# Patient Record
Sex: Male | Born: 1975 | Race: White | Hispanic: No | Marital: Married | Smoking: Former smoker
Health system: Southern US, Community
[De-identification: ages and names within clinical notes are randomized; demographics above are authoritative.]

## PROBLEM LIST (undated history)

## (undated) DIAGNOSIS — E039 Hypothyroidism, unspecified: Secondary | ICD-10-CM

## (undated) DIAGNOSIS — R011 Cardiac murmur, unspecified: Secondary | ICD-10-CM

## (undated) DIAGNOSIS — F329 Major depressive disorder, single episode, unspecified: Secondary | ICD-10-CM

## (undated) DIAGNOSIS — I10 Essential (primary) hypertension: Secondary | ICD-10-CM

## (undated) DIAGNOSIS — E119 Type 2 diabetes mellitus without complications: Secondary | ICD-10-CM

## (undated) DIAGNOSIS — F32A Depression, unspecified: Secondary | ICD-10-CM

## (undated) HISTORY — DX: Hypothyroidism, unspecified: E03.9

## (undated) HISTORY — DX: Depression, unspecified: F32.A

## (undated) HISTORY — DX: Cardiac murmur, unspecified: R01.1

## (undated) HISTORY — PX: CHOLECYSTECTOMY: SHX55

## (undated) HISTORY — DX: Major depressive disorder, single episode, unspecified: F32.9

---

## 2009-05-15 ENCOUNTER — Ambulatory Visit: Payer: Self-pay | Admitting: Family Medicine

## 2009-05-15 DIAGNOSIS — Z87891 Personal history of nicotine dependence: Secondary | ICD-10-CM | POA: Insufficient documentation

## 2009-05-15 DIAGNOSIS — F329 Major depressive disorder, single episode, unspecified: Secondary | ICD-10-CM

## 2009-05-15 DIAGNOSIS — R635 Abnormal weight gain: Secondary | ICD-10-CM | POA: Insufficient documentation

## 2009-05-15 DIAGNOSIS — E669 Obesity, unspecified: Secondary | ICD-10-CM | POA: Insufficient documentation

## 2009-05-15 DIAGNOSIS — F3289 Other specified depressive episodes: Secondary | ICD-10-CM | POA: Insufficient documentation

## 2010-01-05 ENCOUNTER — Encounter: Admission: RE | Admit: 2010-01-05 | Discharge: 2010-01-05 | Payer: Self-pay | Admitting: Family Medicine

## 2010-01-05 ENCOUNTER — Ambulatory Visit: Payer: Self-pay | Admitting: Family Medicine

## 2010-01-05 DIAGNOSIS — R079 Chest pain, unspecified: Secondary | ICD-10-CM | POA: Insufficient documentation

## 2010-01-05 DIAGNOSIS — R5381 Other malaise: Secondary | ICD-10-CM | POA: Insufficient documentation

## 2010-01-05 DIAGNOSIS — R0602 Shortness of breath: Secondary | ICD-10-CM | POA: Insufficient documentation

## 2010-01-05 DIAGNOSIS — R5383 Other fatigue: Secondary | ICD-10-CM

## 2010-01-06 ENCOUNTER — Encounter: Payer: Self-pay | Admitting: Family Medicine

## 2010-01-06 ENCOUNTER — Encounter (INDEPENDENT_AMBULATORY_CARE_PROVIDER_SITE_OTHER): Payer: Self-pay | Admitting: *Deleted

## 2010-01-06 ENCOUNTER — Ambulatory Visit: Payer: Self-pay | Admitting: Cardiology

## 2010-01-06 ENCOUNTER — Encounter: Payer: Self-pay | Admitting: Cardiology

## 2010-01-06 DIAGNOSIS — E1169 Type 2 diabetes mellitus with other specified complication: Secondary | ICD-10-CM | POA: Insufficient documentation

## 2010-01-06 DIAGNOSIS — E119 Type 2 diabetes mellitus without complications: Secondary | ICD-10-CM | POA: Insufficient documentation

## 2010-01-06 DIAGNOSIS — E785 Hyperlipidemia, unspecified: Secondary | ICD-10-CM | POA: Insufficient documentation

## 2010-01-06 DIAGNOSIS — E039 Hypothyroidism, unspecified: Secondary | ICD-10-CM | POA: Insufficient documentation

## 2010-01-06 LAB — CONVERTED CEMR LAB
AST: 60 units/L — ABNORMAL HIGH (ref 0–37)
Alkaline Phosphatase: 100 units/L (ref 39–117)
Bilirubin, Direct: 0.1 mg/dL (ref 0.0–0.3)
CO2: 26 meq/L (ref 19–32)
Calcium: 10.2 mg/dL (ref 8.4–10.5)
Chloride: 98 meq/L (ref 96–112)
Cholesterol: 305 mg/dL — ABNORMAL HIGH (ref 0–200)
Eosinophils Relative: 6 % — ABNORMAL HIGH (ref 0–5)
Glucose, Bld: 153 mg/dL — ABNORMAL HIGH (ref 70–99)
HCT: 51.2 % (ref 39.0–52.0)
Indirect Bilirubin: 0.6 mg/dL (ref 0.0–0.9)
Lymphocytes Relative: 26 % (ref 12–46)
MCHC: 33.8 g/dL (ref 30.0–36.0)
MCV: 91.6 fL (ref 78.0–100.0)
Monocytes Absolute: 0.4 10*3/uL (ref 0.1–1.0)
Monocytes Relative: 5 % (ref 3–12)
Platelets: 240 10*3/uL (ref 150–400)
RBC: 5.59 M/uL (ref 4.22–5.81)
Total Bilirubin: 0.7 mg/dL (ref 0.3–1.2)
Total Protein: 8.5 g/dL — ABNORMAL HIGH (ref 6.0–8.3)
Triglycerides: 618 mg/dL — ABNORMAL HIGH (ref <150)

## 2010-01-07 LAB — CONVERTED CEMR LAB
Free T4: 0.4 ng/dL — ABNORMAL LOW (ref 0.80–1.80)
T3, Free: 1.9 pg/mL — ABNORMAL LOW (ref 2.3–4.2)

## 2010-01-27 ENCOUNTER — Ambulatory Visit: Payer: Self-pay | Admitting: Family Medicine

## 2010-01-27 DIAGNOSIS — I10 Essential (primary) hypertension: Secondary | ICD-10-CM | POA: Insufficient documentation

## 2010-02-04 ENCOUNTER — Telehealth: Payer: Self-pay | Admitting: Pulmonary Disease

## 2010-02-25 ENCOUNTER — Telehealth: Payer: Self-pay | Admitting: Family Medicine

## 2010-08-25 NOTE — Assessment & Plan Note (Signed)
Summary: SHORT OF BREATH AND SLIGHT CHEST PAIN//VGJ   Vital Signs:  Patient profile:   35 year old male Height:      74 inches Weight:      317 pounds BMI:     40.85 O2 Sat:      96 % on Room air Pulse rate:   93 / minute BP sitting:   148 / 90  (left arm) Cuff size:   large  Vitals Entered By: Payton Spark CMA (January 05, 2010 1:10 PM)  O2 Flow:  Room air CC: DOE and chest pain x 2 months (since starting less active job)   History of Present Illness: 35 yo man here today for chest pain and DOE.  changed jobs 2 months ago and has become much less active.  having pains in chest 'much more frequently'.  pain described as a tightness that extends into the shoulder.  'i don't smoke but my ability to breathe has been cut in half'.  having 'labored' breathing w/ normal walking.  + edema of hands and feet at end of the day.  will note that he awakens from sleep 'gasping for breath'.  + loud snoring per wife, 'it's getting worse'.  + reports of waking from sleep due to apnea.  has gained 26 lbs.  has constant complaints of fatigue despite 'sleeping for hours'.  Current Medications (verified): 1)  Fluoxetine Hcl 10 Mg Caps (Fluoxetine Hcl) .Marland Kitchen.. 1 Capsule By Mouth Once Daily X 1 Wk; Then 2 Capsules By Mouth Once Daily  Allergies (verified): No Known Drug Allergies  Past History:  Past Medical History: Last updated: 05/15/2009 depression obesity  Family History: Last updated: 01/05/2010 father healthy mother DM, HTN, depression PGF ETOH sister healthy MGF- CAD M Uncle- sleep apnea  Family History: father healthy mother DM, HTN, depression PGF ETOH sister healthy MGF- CAD M Uncle- sleep apnea  Review of Systems      See HPI  Physical Exam  General:  alert, well-developed, well-nourished, and well-hydrated.  obese Head:  normocephalic and atraumatic.   Mouth:  good dentition and pharynx pink and moist.  narrow opening between tongue and palate Neck:  no masses but very  thick Lungs:  Normal respiratory effort, chest expands symmetrically. Lungs are clear to auscultation, no crackles or wheezes. Heart:  Normal rate and regular rhythm. S1 and S2 normal without gallop, murmur, click, rub or other extra sounds. Abdomen:  obese, soft, NT/ND, +BS Pulses:  + 2 carotid, radial, DP Extremities:  no C/C/E   Impression & Recommendations:  Problem # 1:  CHEST PAIN, LEFT (ICD-786.50) Assessment New pt's CP atypical but need to r/o cardiac given obesity and recent 26 lb wt gain.  EKG w/out acute abnormalities.  check CXR and refer to cards. Orders: EKG w/ Interpretation (93000) T-2 View CXR (71020TC) Cardiology Referral (Cardiology)  Problem # 2:  SHORTNESS OF BREATH (ICD-786.05) Assessment: New ? sleep apnea and likely restrictive component due to body habitus.  refer to pulm/cards. Orders: Cardiology Referral (Cardiology) Pulmonary Referral (Pulmonary)  Problem # 3:  OBESITY, UNSPECIFIED (ICD-278.00) Assessment: Deteriorated pt has gained 20+ lbs in 6 months.  check thyroid, encouraged healthy diet and regular exercise.  Problem # 4:  FATIGUE (ICD-780.79) Assessment: New pt's fatigue possibly multifactorial- ? sleep apnea, obesity, ? diabetes.  check labs.  start meds as needed.  follow closely.  Complete Medication List: 1)  Fluoxetine Hcl 10 Mg Caps (Fluoxetine hcl) .Marland Kitchen.. 1 capsule by mouth once daily x 1  wk; then 2 capsules by mouth once daily 2)  Metformin Hcl 500 Mg Tabs (Metformin hcl) .... Take 1 tab by mouth once daily x 1 week then increase to 2 tabs by mouth once daily 3)  Synthroid 100 Mcg Tabs (Levothyroxine sodium) .... Take 1 tab by mouth every morning 30 minutes before breakfast and apart from other medications  Other Orders: Venipuncture (16109) TLB-Lipid Panel (80061-LIPID) TLB-Hepatic/Liver Function Pnl (80076-HEPATIC) TLB-BMP (Basic Metabolic Panel-BMET) (80048-METABOL) TLB-TSH (Thyroid Stimulating Hormone) (84443-TSH) TLB-CBC  Platelet - w/Differential (85025-CBCD)  Patient Instructions: 1)  Please schedule a follow-up appointment in 2 weeks w/ Dr Cathey Endow to recheck your blood pressure and review where you are in the course of your work up. 2)  Someone will call you with your pulmonary and cardiology appts 3)  We'll notify you of your labs and Xray 4)  Try and make healthy food choices and get regular exercise 5)  If your symptoms worsen- increased pain, shortness of breath, or other concerns- please go to the ER 6)  Ibuprofen 600mg  (3 pills) every 6-8 hours for your rib pain 7)  Hang in there!  Prescriptions: FLUOXETINE HCL 10 MG CAPS (FLUOXETINE HCL) 1 capsule by mouth once daily x 1 wk; then 2 capsules by mouth once daily  #60 x 1   Entered and Authorized by:   Neena Rhymes MD   Signed by:   Neena Rhymes MD on 01/05/2010   Method used:   Electronically to        [facility] [phone]* (retail)       [address]       [place], [place]  [address]       Ph: [phone]       Fax: [phone]   RxID:   [id number]

## 2010-08-25 NOTE — Assessment & Plan Note (Signed)
Summary: [facility]   Visit Type:  Initial Consult Primary Provider:  Seymour Bars DO  CC:  Chest tightness-Left arm pain.  History of Present Illness: 35 yo male for evaluation of chest pain and dyspnea. Patient states that he had a murmur as a child but no other cardiac history. Over the past several months he describes an occasional chest pain. He is in the substernal area and described as a pressure radiating to the left shoulder. It lasts one to 2 minutes and resolved spontaneously. There is no associated symptom. It is not pleuritic, positional, exertional, or related to food. It resolved spontaneously. He has also noticed increased dyspnea on exertion for several months. There is no orthopnea or PND but he does state there is occasional mild edema in his feet. He also notices a 20 pound weight gain and increased fatigue. There is no heat intolerance. Because of this chest pain and dyspnea we were asked to further evaluate. Note the patient had laboratories drawn on June 13 and his TSH was 171. Cholesterol 305; Hgb 17.3; mildly elevated LFTs. HGB A1C 7.5.  Current Medications (verified): 1)  Fluoxetine Hcl 10 Mg Caps (Fluoxetine Hcl) .Marland Kitchen.. 1 Capsule By Mouth Once Daily X 1 Wk; Then 2 Capsules By Mouth Once Daily  Allergies (verified): No Known Drug Allergies  Past History:  Past Medical History: depression Heart murmur as a child  Past Surgical History: cholecystectomy  Family History: Reviewed history from 05/15/2009 and no changes required. father healthy mother DM, HTN, depression PGF ETOH sister healthy  Social History: Reviewed history from 05/15/2009 and no changes required. 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 Married to [place].  Has twin 63 yo daughters. Quit smoking Jan 2010. Works out 3 days/ wk. 3 ETOH /wk  Review of Systems       Complains of weight gain, and fatigue. no fevers or chills, productive cough, hemoptysis, dysphasia,  odynophagia, melena, hematochezia, dysuria, hematuria, rash, seizure activity, orthopnea, PND,  claudication. Remaining systems are negative.   Vital Signs:  Patient profile:   35 year old male Height:      74 inches Weight:      306.75 pounds BMI:     39.53 Pulse rate:   96 / minute Pulse rhythm:   regular Resp:     18 per minute BP sitting:   130 / 100  (right arm) Cuff size:   large  Vitals Entered By: Vikki Ports (January 06, 2010 3:11 PM)  Physical Exam  General:  Well developed/obese in NAD Skin warm/dry; tatoos Patient not depressed No peripheral clubbing Back-normal HEENT-normal/normal eyelids Neck supple/normal carotid upstroke bilaterally; no bruits; no JVD; no thyromegaly chest - CTA/ normal expansion CV - RRR/normal S1 and S2; no murmurs, rubs or gallops;  PMI nondisplaced Abdomen -NT/ND, no HSM, no mass, + bowel sounds, no bruit 2+ femoral pulses, no bruits Ext-no edema, chords, 2+ DP Neuro-grossly nonfocal     Impression & Recommendations:  Problem # 1:  CHEST PAIN, LEFT (ICD-786.50)  Symptoms atypical. Schedule stress echocardiogram.  Orders: Stress Echo (Stress Echo)  Problem # 2:  HYPOTHYROIDISM (ICD-244.9) TSH markedly elevated. Patient also complains of weight gain, fatigue and dyspnea. I will discuss this with Dr. Cathey Endow. He needs to be placed on Synthroid. This will be managed by primary care. This certainly could be contributing to his hyperlipidemia as well. The echocardiogram will also help quantify LV function.  Problem # 3:  DM (ICD-250.00) Hemoglobin A1c is elevated and  he needs to begin diet and followup with primary care for possible diabetes.  Problem # 4:  HYPERLIPIDEMIA (ICD-272.4) Begin diet, treat hypothyroidism and then repeat labs. May need therapy in the future. Management per primary care.  Problem # 5:  OBESITY, UNSPECIFIED (ICD-278.00) This may be contributing to elevated LFTs ( fatty liver); further eval of elevated LFTs  per primary care. Also needs eval for sleep apnea. Discussed weight loss. F/U blood pressures with primary care and if remains elevated, add anti-hypertensive. He needs significant life style and risk factor modification.  Patient Instructions: 1)  Your physician recommends that you schedule a follow-up appointment in: AS NEEDED PENDING TEST RESULTS 2)  Your physician has requested that you have a stress echocardiogram. For further information please visit [email or web address]  Please follow instruction sheet as given.

## 2010-08-25 NOTE — Letter (Signed)
Summary: Out of Work  [facility]  [address]   [place], [place] [address]   Phone: [phone]  Fax: [phone]    January 27, 2010   Employee:  Austin Weeks    To Whom It May Concern:   For Medical reasons, please excuse the above named employee from work for the following dates:  Start:   July 5th  End:   July 6th  If you need additional information, please feel free to contact our office.         Sincerely,    Seymour Bars DO

## 2010-08-25 NOTE — Progress Notes (Signed)
Summary: nos appt  Phone Note Call from Patient   Caller: [email or web address]  Call For: alva Summary of Call: LMTCB x2 to rsc nos from 7/12. Initial call taken by: Darletta Moll,  February 04, 2010 3:23 PM

## 2010-08-25 NOTE — Progress Notes (Signed)
Summary: Diabetic counseling appts  Phone Note From Other Clinic Call back at [phone]   Summary of Call: SW scheduler at [facility], pt has not returned calls to set up appts for Diabetic counseling Initial call taken by: Lannette Donath,  February 25, 2010 2:28 PM  Follow-up for Phone Call        I called patient and lmom &  ask that he call the scheduler back to schedule his Diabetic Counseling.Michaelle Copas  March 06, 2010 4:54 PM

## 2010-08-25 NOTE — Letter (Signed)
Summary: Work Writer at [facility] [address], [address]   [place], [place] [address]   Phone: [phone]  Fax:      January 06, 2010    Austin Weeks   The above named patient had a medical visit today at: 3:15  pm.  Please take this into consideration when reviewing the time away from work/school.      Sincerely yours,  Architectural technologist

## 2010-08-25 NOTE — Assessment & Plan Note (Signed)
Summary: new dx of DM   Vital Signs:  Patient profile:   35 year old male Weight:      307 pounds O2 Sat:      98 % on Room air Temp:     98.3 degrees F oral Pulse rate:   92 / minute BP sitting:   140 / 96  (right arm) Cuff size:   large  O2 Flow:  Room air CC: f/u from last visit and new medications   Is Patient Diabetic? Yes Did you bring your meter with you today? No   Primary Care Provider:  Seymour Bars DO  CC:  f/u from last visit and new medications  .  History of Present Illness: Austin Weeks presents for f/u newly diagnosed T2DM and hypothryoidism.  he is on Metformin 1 gram once a day and now on Synthroid 100 mg / daily.  He is feeling better.  He has a fam hx of both.  He has not had any nutrition teaching yet.  He has not gotten a meter yet.  He is tolerating the meds well.  He does not have an eye doctor.  he has not yet had a PNX, monofilament, urine microalbumin.  He is not having any blurry vision but has more frequent urination.  He admits to drinking about 5 sodas/ day which he has cut back on.      Allergies (verified): No Known Drug Allergies  Past History:  Past Medical History: depression Heart murmur as a child T2DM 12-2009 hypothyroidism  Social History: Reviewed history from 05/15/2009 and no changes required. 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 Married to [place].  Has twin 67 yo daughters. Quit smoking Jan 2010. Works out 3 days/ wk. 3 ETOH /wk  Review of Systems      See HPI  Physical Exam  General:  alert, well-developed, well-nourished, and well-hydrated.  obese Head:  normocephalic and atraumatic.   Mouth:  pharynx pink and moist.   Neck:  no masses.   Lungs:  Normal respiratory effort, chest expands symmetrically. Lungs are clear to auscultation, no crackles or wheezes. Heart:  Normal rate and regular rhythm. S1 and S2 normal without gallop, murmur, click, rub or other extra sounds. Extremities:  no LE edema Skin:   color normal.   Cervical Nodes:  No lymphadenopathy noted Psych:  good eye contact, not anxious appearing, and not depressed appearing.     Impression & Recommendations:  Problem # 1:  DM (ICD-250.00) Counseled on his new diagnosis.  He is doing well on metformin 1 gram daily.  Will set up for nutrition classes to learn diabetic diet.  He will start monitoring his sugars at home, instruced him on goals.  Will set up a dilated eye exam and will update his urine micro, monofilament and PNX at next visit.  F/U in 6 wks and will update labs then. His updated medication list for this problem includes:    Metformin Hcl 1000 Mg Tabs (Metformin hcl) .Marland Kitchen... 1 tab by mouth once a day    Lisinopril 10 Mg Tabs (Lisinopril) .Marland Kitchen... 1 tab by mouth daily  Orders: Ophthalmology Referral (Ophthalmology) Nutrition Referral (Nutrition)  Labs Reviewed: Creat: 1.42 (01/05/2010)    Reviewed HgBA1c results: 7.5 (01/06/2010)  Problem # 2:  HYPOTHYROIDISM (ICD-244.9) Doing well on meds.  TSH in 6 wks. His updated medication list for this problem includes:    Synthroid 100 Mcg Tabs (Levothyroxine sodium) .Marland Kitchen... Take 1 tab by mouth  every morning 30 minutes before breakfast and apart from other medications  Labs Reviewed: TSH: 171.482 (01/05/2010)    HgBA1c: 7.5 (01/06/2010) Chol: 305 (01/05/2010)   HDL: 30 (01/05/2010)   LDL: See Comment mg/dL (04/54/0981)   TG: 191 (01/05/2010)  Problem # 3:  DEPRESSIVE DISORDER NOT ELSEWHERE CLASSIFIED (ICD-311) Improving on meds.  Continue. His updated medication list for this problem includes:    Fluoxetine Hcl 10 Mg Caps (Fluoxetine hcl) .Marland Kitchen... 1 capsule by mouth once daily x 1 wk; then 2 capsules by mouth once daily  Problem # 4:  ESSENTIAL HYPERTENSION, BENIGN (ICD-401.1) Assessment: New  His updated medication list for this problem includes:    Lisinopril 10 Mg Tabs (Lisinopril) .Marland Kitchen... 1 tab by mouth daily  BP today: 140/96 Prior BP: 130/100 (01/06/2010)  Labs  Reviewed: K+: 4.7 (01/05/2010) Creat: : 1.42 (01/05/2010)   Chol: 305 (01/05/2010)   HDL: 30 (01/05/2010)   LDL: See Comment mg/dL (47/82/9562)   TG: 130 (01/05/2010)  Complete Medication List: 1)  Fluoxetine Hcl 10 Mg Caps (Fluoxetine hcl) .Marland Kitchen.. 1 capsule by mouth once daily x 1 wk; then 2 capsules by mouth once daily 2)  Metformin Hcl 1000 Mg Tabs (Metformin hcl) .Marland Kitchen.. 1 tab by mouth once a day 3)  Synthroid 100 Mcg Tabs (Levothyroxine sodium) .... Take 1 tab by mouth every morning 30 minutes before breakfast and apart from other medications 4)  Lisinopril 10 Mg Tabs (Lisinopril) .Marland Kitchen.. 1 tab by mouth daily 5)  One Touch Ultra Glucometer  .... Dx: 250.00 use as directed 6)  One Touch Ultra Test Strips  .... Use once daily as directed 7)  Monolet Lancets Misc (Lancets) .... Use daily as directed  Patient Instructions: 1)  START CHECK BLOOD SUGARS: 2)  AM FASTING GOAL 80-110 3)  2 HRS AFTER DINNER GOAL <150. 4)  YOU DO NOT NEED TO CHECK IT TWICE A DAY EVERYDAY. 5)  Will set you up for nutritionist visit and diabetic eye exam. 6)  Next visit: labs, Pneumovax, urine microalbumin. 7)  Add Lisinopril once a day for BP. 8)  REturn for f/u diabetes/ BP in 6 wks. Prescriptions: MONOLET LANCETS  MISC (LANCETS) use daily as directed  #100 x 1   Entered and Authorized by:   Seymour Bars DO   Signed by:   Seymour Bars DO on 01/27/2010   Method used:   Electronically to        [facility] [address] [address] (retail)       [address]       [place], [place]  [address]       Ph: [phone]       Fax: [phone]   RxID:   [id number] ONE TOUCH ULTRA TEST STRIPS use once daily as directed  #30 x 3   Entered and Authorized by:   Seymour Bars DO   Signed by:   Seymour Bars DO on 01/27/2010   Method used:   Printed then faxed to ...       [facility] [address] (retail)       [address]       [place], [place]  [address]       Ph: [phone]        Fax: [phone]   RxID:   [id number] ONE TOUCH ULTRA GLUCOMETER Dx: 250.00 use as directed  #1 x 0   Entered and Authorized by:  Seymour Bars DO   Signed by:   Seymour Bars DO on 01/27/2010   Method used:   Printed then faxed to ...       [facility] (retail)       [address]       [place], [place]  [address]       Ph: [phone]       Fax: [phone]   RxID:   [id number] LISINOPRIL 10 MG TABS (LISINOPRIL) 1 tab by mouth daily  #30 x 1   Entered and Authorized by:   Seymour Bars DO   Signed by:   Seymour Bars DO on 01/27/2010   Method used:   Electronically to        [facility] [address] (retail)       [address]       [place], [place]  [address]       Ph: [phone]       Fax: [phone]   RxID:   [id number]

## 2011-03-09 ENCOUNTER — Encounter: Payer: Self-pay | Admitting: Cardiology

## 2015-04-28 ENCOUNTER — Encounter: Payer: Self-pay | Admitting: Physician Assistant

## 2015-04-28 ENCOUNTER — Ambulatory Visit (INDEPENDENT_AMBULATORY_CARE_PROVIDER_SITE_OTHER): Payer: BLUE CROSS/BLUE SHIELD | Admitting: Physician Assistant

## 2015-04-28 VITALS — BP 116/80 | HR 79 | Ht 74.0 in | Wt 252.0 lb

## 2015-04-28 DIAGNOSIS — E118 Type 2 diabetes mellitus with unspecified complications: Secondary | ICD-10-CM | POA: Insufficient documentation

## 2015-04-28 DIAGNOSIS — F32A Depression, unspecified: Secondary | ICD-10-CM

## 2015-04-28 DIAGNOSIS — Z23 Encounter for immunization: Secondary | ICD-10-CM | POA: Diagnosis not present

## 2015-04-28 DIAGNOSIS — F411 Generalized anxiety disorder: Secondary | ICD-10-CM | POA: Insufficient documentation

## 2015-04-28 DIAGNOSIS — E038 Other specified hypothyroidism: Secondary | ICD-10-CM

## 2015-04-28 DIAGNOSIS — F329 Major depressive disorder, single episode, unspecified: Secondary | ICD-10-CM

## 2015-04-28 DIAGNOSIS — Z794 Long term (current) use of insulin: Secondary | ICD-10-CM | POA: Insufficient documentation

## 2015-04-28 DIAGNOSIS — I10 Essential (primary) hypertension: Secondary | ICD-10-CM

## 2015-04-28 LAB — POCT GLYCOSYLATED HEMOGLOBIN (HGB A1C): Hemoglobin A1C: >14

## 2015-04-28 MED ORDER — DAPAGLIFLOZIN PRO-METFORMIN ER 10-1000 MG PO TB24: 1.0000 | ORAL_TABLET | Freq: Every day | ORAL | Status: DC

## 2015-04-28 MED ORDER — OMEPRAZOLE 40 MG PO CPDR: 40.0000 mg | DELAYED_RELEASE_CAPSULE | Freq: Every day | ORAL | Status: DC

## 2015-04-28 MED ORDER — INSULIN GLARGINE 100 UNIT/ML SOLOSTAR PEN: 35.0000 [IU] | PEN_INJECTOR | Freq: Every day | SUBCUTANEOUS | Status: DC

## 2015-04-28 MED ORDER — FLUOXETINE HCL 10 MG PO CAPS
10.0000 mg | ORAL_CAPSULE | Freq: Every day | ORAL | Status: DC
Start: 2015-04-28 — End: 2015-10-24

## 2015-04-28 MED ORDER — LEVOTHYROXINE SODIUM 100 MCG PO TABS: 100.0000 ug | ORAL_TABLET | Freq: Every day | ORAL | Status: DC

## 2015-04-28 MED ORDER — AMBULATORY NON FORMULARY MEDICATION: Status: DC

## 2015-04-28 MED ORDER — LISINOPRIL 5 MG PO TABS: 5.0000 mg | ORAL_TABLET | Freq: Every day | ORAL | Status: DC

## 2015-05-01 DIAGNOSIS — F32A Depression, unspecified: Secondary | ICD-10-CM | POA: Insufficient documentation

## 2015-05-01 DIAGNOSIS — F329 Major depressive disorder, single episode, unspecified: Secondary | ICD-10-CM | POA: Insufficient documentation

## 2015-05-01 NOTE — Progress Notes (Signed)
Subjective:    Patient ID: Austin Weeks, male    DOB: Nov 29, 1975, 39 y.o.   MRN: [id number]  HPI Pt is a 39 yo male who presents to the clinic to establish care.   .. Active Ambulatory Problems    Diagnosis Date Noted  . HYPOTHYROIDISM 01/06/2010  . DM 01/06/2010  . HYPERLIPIDEMIA 01/06/2010  . OBESITY, UNSPECIFIED 05/15/2009  . DEPRESSIVE DISORDER NOT ELSEWHERE CLASSIFIED 05/15/2009  . ESSENTIAL HYPERTENSION, BENIGN 01/27/2010  . FATIGUE 01/05/2010  . WEIGHT GAIN 05/15/2009  . SHORTNESS OF BREATH 01/05/2010  . CHEST PAIN, LEFT 01/05/2010  . TOBACCO USE, QUIT 05/15/2009  . Generalized anxiety disorder 04/28/2015  . Type 2 diabetes mellitus with complication (HCC) 04/28/2015  . Other specified hypothyroidism 04/28/2015  . Depression 05/01/2015   Resolved Ambulatory Problems    Diagnosis Date Noted  . No Resolved Ambulatory Problems   Past Medical History  Diagnosis Date  . Depressed   . Heart murmur   . Hypothyroidism    .Marland Kitchen Family History  Problem Relation Age of Onset  . Depression Mother   . Diabetes Mother   . Hypertension Mother   . Hyperlipidemia Mother   . Sleep apnea Maternal Uncle   . Alcohol abuse Maternal Uncle   . Diabetes Maternal Uncle   . Coronary artery disease Maternal Grandfather   . Heart attack Maternal Grandfather   . Alcohol abuse Paternal Grandfather   . Stroke Paternal Grandfather   . Diabetes Maternal Aunt   . Alcohol abuse Paternal Uncle   . Cancer Maternal Grandmother   . Diabetes Maternal Aunt    .Marland Kitchen Social History   Social History  . Marital Status: Married    Spouse Name: N/A  . Number of Children: N/A  . Years of Education: N/A   Occupational History  . Not on file.   Social History Main Topics  . Smoking status: Former Games developer  . Smokeless tobacco: Not on file     Comment: quit Jan 2010   . Alcohol Use: Yes     Comment: 3 etoh/week  . Drug Use: No  . Sexual Activity: Yes   Other Topics Concern  . Not on file    Social History Narrative   Works out 3 days/wk.    Pt presents to the clinic to get medications.   Pt is a diabetic and not taken any medications in the last 3 months. He knows his sugars are elevated. He is very thristy and frequent urination. Not checking sugars. No neuropathy. He has had some blurred vision.   GERD- needs refill for omeprazole.   Depression-would like to start prozac back.     Review of Systems  All other systems reviewed and are negative.      Objective:   Physical Exam  Constitutional: He is oriented to person, place, and time. He appears well-developed and well-nourished.  HENT:  Head: Normocephalic and atraumatic.  Cardiovascular: Normal rate, regular rhythm and normal heart sounds.   Pulmonary/Chest: Effort normal and breath sounds normal.  Neurological: He is alert and oriented to person, place, and time.  Skin: Skin is dry.  Psychiatric: He has a normal mood and affect. His behavior is normal.          Assessment & Plan:  DM- .Marland Kitchen Lab Results  Component Value Date   HGBA1C >14 04/28/2015   Pt has been out of medications for months.  Restart lantus and metformin.  Add xigduo daily. Discussed SE's.  Discussed  needs eye exam.  mircoalbumin extremely high. Pt is on lisinopril.  Keep monitoring blood glucose level keep fasting between 80-120.  Flu and pneumovax 23.  Follow up in 3 months.   Depression/anxiety- restart prozac  daily.   GERD- restarted omeprazole  daily.

## 2015-07-29 ENCOUNTER — Ambulatory Visit: Payer: Self-pay | Admitting: Physician Assistant

## 2015-10-08 ENCOUNTER — Telehealth: Payer: Self-pay | Admitting: *Deleted

## 2015-10-08 MED ORDER — INSULIN GLARGINE 300 UNIT/ML ~~LOC~~ SOPN: 35.0000 [IU] | PEN_INJECTOR | Freq: Every day | SUBCUTANEOUS | Status: DC

## 2015-10-08 NOTE — Telephone Encounter (Addendum)
Received PA for lantus for patient. I feel this medication will be denied since he has not tried any of the preferred drugs on the formulary. This was confirmed when I called patient.The patient states he has been out of medication.Marland Kitchen.spoke with Lesly Rubensteinjade and  Per Lesly RubensteinJade ok to dispense sample of toujeo to patient.Pt has an appt at the end of this month.asked that he bring insurance card. Will still submit PA on lantus  Received a denial for lantus, denial letter place in [place]

## 2015-10-24 ENCOUNTER — Ambulatory Visit (INDEPENDENT_AMBULATORY_CARE_PROVIDER_SITE_OTHER): Payer: BLUE CROSS/BLUE SHIELD | Admitting: Physician Assistant

## 2015-10-24 ENCOUNTER — Encounter: Payer: Self-pay | Admitting: Physician Assistant

## 2015-10-24 VITALS — BP 132/84 | HR 83 | Wt 274.0 lb

## 2015-10-24 DIAGNOSIS — Z794 Long term (current) use of insulin: Secondary | ICD-10-CM

## 2015-10-24 DIAGNOSIS — E039 Hypothyroidism, unspecified: Secondary | ICD-10-CM

## 2015-10-24 DIAGNOSIS — I10 Essential (primary) hypertension: Secondary | ICD-10-CM

## 2015-10-24 DIAGNOSIS — F411 Generalized anxiety disorder: Secondary | ICD-10-CM

## 2015-10-24 DIAGNOSIS — K0889 Other specified disorders of teeth and supporting structures: Secondary | ICD-10-CM

## 2015-10-24 DIAGNOSIS — F329 Major depressive disorder, single episode, unspecified: Secondary | ICD-10-CM

## 2015-10-24 DIAGNOSIS — E118 Type 2 diabetes mellitus with unspecified complications: Secondary | ICD-10-CM | POA: Diagnosis not present

## 2015-10-24 DIAGNOSIS — F32A Depression, unspecified: Secondary | ICD-10-CM

## 2015-10-24 LAB — POCT GLYCOSYLATED HEMOGLOBIN (HGB A1C): Hemoglobin A1C: 10.2

## 2015-10-24 MED ORDER — OMEPRAZOLE 40 MG PO CPDR: 40.0000 mg | DELAYED_RELEASE_CAPSULE | Freq: Every day | ORAL | Status: DC

## 2015-10-24 MED ORDER — FLUOXETINE HCL 10 MG PO CAPS: 10.0000 mg | ORAL_CAPSULE | Freq: Every day | ORAL | Status: DC

## 2015-10-24 MED ORDER — DAPAGLIFLOZIN PRO-METFORMIN ER 10-1000 MG PO TB24: 1.0000 | ORAL_TABLET | Freq: Every day | ORAL | Status: DC

## 2015-10-24 MED ORDER — ATORVASTATIN CALCIUM 40 MG PO TABS: 40.0000 mg | ORAL_TABLET | Freq: Every day | ORAL | Status: DC

## 2015-10-24 MED ORDER — HYDROCODONE-ACETAMINOPHEN 5-325 MG PO TABS: 1.0000 | ORAL_TABLET | Freq: Three times a day (TID) | ORAL | Status: DC | PRN

## 2015-10-24 MED ORDER — INSULIN GLARGINE 300 UNIT/ML ~~LOC~~ SOPN: 35.0000 [IU] | PEN_INJECTOR | Freq: Every day | SUBCUTANEOUS | Status: DC

## 2015-10-24 MED ORDER — LISINOPRIL 5 MG PO TABS: 5.0000 mg | ORAL_TABLET | Freq: Every day | ORAL | Status: DC

## 2015-10-24 NOTE — Progress Notes (Signed)
Subjective:    Patient ID: Austin Weeks, male    DOB: 1976-05-31, 40 y.o.   MRN: [id number]  HPI    Review of Systems     Objective:   Physical Exam        Assessment & Plan:   Subjective:     Austin Weeks is a 40 y.o. male who presents for follow up of diabetes.. Current symptoms include: hyperglycemia. Patient denies foot ulcerations, hypoglycemia , paresthesia of the feet, polydipsia, polyuria, visual disturbances and vomiting. Evaluation to date has been: hemoglobin A1C. Home sugars: patient does not check sugars. Current treatments: more intensive attention to diet which has been ineffective and pt is on toujeo, xigduo, lisinopril. he has been out of meds due to insurance cost and issues. . Last dilated eye exam unknown.  Pt needs synthyroid refill as well.   The following portions of the patient's history were reviewed and updated as appropriate: allergies, current medications, past family history, past medical history, past social history, past surgical history and problem list.  Review of Systems Pertinent items are noted in HPI. Pertinent items noted in HPI and remainder of comprehensive ROS otherwise negative.    Objective:    BP 132/84 mmHg  Pulse 83  Wt 274 lb (124.286 kg)  General Appearance:    Alert, cooperative, no distress, appears stated age  Head:    Normocephalic, without obvious abnormality, atraumatic  Eyes:    PERRL, conjunctiva/corneas clear, EOM's intact, fundi    benign, both eyes       Ears:    Normal TM's and external ear canals, both ears  Nose:   Nares normal, septum midline, mucosa normal, no drainage    or sinus tenderness  Throat:   Lips, mucosa, and tongue normal; teeth and gums normal  Neck:   Supple, symmetrical, trachea midline, no adenopathy;       thyroid:  No enlargement/tenderness/nodules; no carotid   bruit or JVD  Back:     Symmetric, no curvature, ROM normal, no CVA tenderness  Lungs:     Clear to auscultation  bilaterally, respirations unlabored  Chest wall:    No tenderness or deformity  Heart:    Regular rate and rhythm, S1 and S2 normal, no murmur, rub   or gallop  Abdomen:     Soft, non-tender, bowel sounds active all four quadrants,    no masses, no organomegaly  Genitalia:    Normal male without lesion, discharge or tenderness  Rectal:    Normal tone, normal prostate, no masses or tenderness;   guaiac negative stool  Extremities:   Extremities normal, atraumatic, no cyanosis or edema  Pulses:   2+ and symmetric all extremities  Skin:   Skin color, texture, turgor normal, no rashes or lesions  Lymph nodes:   Cervical, supraclavicular, and axillary nodes normal  Neurologic:   CNII-XII intact. Normal strength, sensation and reflexes      throughout      @  Patient was not evaluated for proper footwear and sizing.  Laboratory: No components found for: A1C    Assessment:    Diabetes mellitus Type II, under poor control.    Plan:    .Marland Kitchen Lab Results  Component Value Date   HGBA1C 10.2 10/24/2015   Pt encouraged to start checking sugars.  Continue xigduo  Discussed general issues about diabetes pathophysiology and management. Discussed foot care. Reminded to get yearly retinal exam. Increased dose of insulin: toujeo to increase until  fasting sugars between 100-120 every morning. . Continued ACE inhibitor; see medication orders. Reminded to bring in blood sugar diary at next visit.    hypothyroidism- start back on levothyroxine and will recheck in 3 months.

## 2015-10-28 ENCOUNTER — Other Ambulatory Visit: Payer: Self-pay | Admitting: *Deleted

## 2015-10-28 MED ORDER — INSULIN DEGLUDEC 100 UNIT/ML ~~LOC~~ SOPN: 35.0000 [IU] | PEN_INJECTOR | Freq: Every day | SUBCUTANEOUS | Status: DC

## 2015-10-28 NOTE — Telephone Encounter (Signed)
Inititated PA but will possibly be denied due to not having tried formulary alternatives levemir,basaglar Evaristo Buryresiba. PA faxed. Ok to for patient to try [place]-B[place]uresiba per Cydney OkJade  Jade can you write this rx since this will be the initial rx we are sending  Left message with mom for him to call me back

## 2015-10-29 ENCOUNTER — Other Ambulatory Visit: Payer: Self-pay | Admitting: *Deleted

## 2015-10-29 MED ORDER — LEVOTHYROXINE SODIUM 100 MCG PO TABS: 100.0000 ug | ORAL_TABLET | Freq: Every day | ORAL | Status: DC

## 2015-11-03 NOTE — Telephone Encounter (Signed)
Called patient and the Evaristo Buryresiba was $500. I let patient know that there is a copay card for this med and I will leave it up front for him to pick up

## 2015-11-05 ENCOUNTER — Emergency Department (INDEPENDENT_AMBULATORY_CARE_PROVIDER_SITE_OTHER)
Admission: EM | Admit: 2015-11-05 | Discharge: 2015-11-05 | Disposition: A | Discharge status: Home / Self Care | Payer: BLUE CROSS/BLUE SHIELD | Source: Home / Self Care | Attending: Emergency Medicine | Admitting: Emergency Medicine

## 2015-11-05 ENCOUNTER — Encounter: Payer: Self-pay | Admitting: Emergency Medicine

## 2015-11-05 DIAGNOSIS — J0120 Acute ethmoidal sinusitis, unspecified: Secondary | ICD-10-CM

## 2015-11-05 LAB — POCT INFLUENZA A/B
INFLUENZA A, POC: NEGATIVE
Influenza B, POC: NEGATIVE

## 2015-11-05 MED ORDER — AMOXICILLIN 500 MG PO CAPS: 500.0000 mg | ORAL_CAPSULE | Freq: Three times a day (TID) | ORAL | Status: DC

## 2015-11-05 NOTE — ED Provider Notes (Signed)
CSN: [id number]     Arrival date & time 11/05/15  1209 History   First MD Initiated Contact with Patient 11/05/15 1258     Chief Complaint  Patient presents with  . URI   (Consider location/radiation/quality/duration/timing/severity/associated sxs/prior Treatment) Patient is a 40 y.o. male presenting with URI. The history is provided by the patient. No language interpreter was used.  URI Presenting symptoms: congestion, cough, ear pain, facial pain and sore throat   Severity:  Moderate Onset quality:  Gradual Timing:  Constant Progression:  Worsening Chronicity:  New Relieved by:  Nothing Worsened by:  Nothing tried Ineffective treatments:  None tried Associated symptoms: sinus pain   Risk factors: no sick contacts   Pt complains of congestion.  Pt concerned.  He reports he gets frequent infections and is susceptible to  Past Medical History  Diagnosis Date  . Depressed   . Heart murmur     as a child  . Hypothyroidism    Past Surgical History  Procedure Laterality Date  . Cholecystectomy     Family History  Problem Relation Age of Onset  . Depression Mother   . Diabetes Mother   . Hypertension Mother   . Hyperlipidemia Mother   . Sleep apnea Maternal Uncle   . Alcohol abuse Maternal Uncle   . Diabetes Maternal Uncle   . Coronary artery disease Maternal Grandfather   . Heart attack Maternal Grandfather   . Alcohol abuse Paternal Grandfather   . Stroke Paternal Grandfather   . Diabetes Maternal Aunt   . Alcohol abuse Paternal Uncle   . Cancer Maternal Grandmother   . Diabetes Maternal Aunt    Social History  Substance Use Topics  . Smoking status: Former Games developer  . Smokeless tobacco: None     Comment: quit Jan 2010   . Alcohol Use: Yes     Comment: 3 etoh/week    Review of Systems  HENT: Positive for congestion, ear pain and sore throat.   Respiratory: Positive for cough.   All other systems reviewed and are negative.   Allergies  Review of patient's  allergies indicates no known allergies.  Home Medications   Prior to Admission medications   Medication Sig Start Date End Date Taking? Authorizing Provider  AMBULATORY NON FORMULARY MEDICATION One touch ultra 2  Testing 2 twice a day. For Diabetes Mellitus type II, uncontrolled. 04/28/15   Jade L Breeback, PA-C  amoxicillin (AMOXIL) 500 MG capsule Take 1 capsule (500 mg total) by mouth 3 (three) times daily. 11/05/15   Elson Areas, PA-C  atorvastatin (LIPITOR) 40 MG tablet Take 1 tablet (40 mg total) by mouth daily. 10/24/15   Jade L Breeback, PA-C  Dapagliflozin-Metformin HCl ER (XIGDUO XR) 04-999 MG TB24 Take 1 tablet by mouth daily. 10/24/15   Jade L Breeback, PA-C  FLUoxetine (PROZAC) 10 MG capsule Take 1 capsule (10 mg total) by mouth daily. 10/24/15   Jade L Breeback, PA-C  glucose blood test strip 1 each by Other route as directed. Use as instructed     Historical Provider, MD  HYDROcodone-acetaminophen (NORCO/VICODIN) 5-325 MG tablet Take 1 tablet by mouth every 8 (eight) hours as needed for moderate pain. 10/24/15   Jade L Breeback, PA-C  Insulin Degludec (TRESIBA FLEXTOUCH) 100 UNIT/ML SOPN Inject 35 Units into the skin at bedtime. 10/28/15   Jade L Breeback, PA-C  Insulin Glargine (TOUJEO SOLOSTAR) 300 UNIT/ML SOPN Inject 35 Units into the skin at bedtime. 10/24/15   Jomarie Longs,  PA-C  levothyroxine (SYNTHROID) 100 MCG tablet Take 1 tablet (100 mcg total) by mouth daily before breakfast. Before breakfast and apart from other meds. 10/29/15   Jade L Breeback, PA-C  lisinopril (PRINIVIL,ZESTRIL) 5 MG tablet Take 1 tablet (5 mg total) by mouth daily. 10/24/15   Jomarie LongsJade L Breeback, PA-C  Monolet Lancets MISC by Does not apply route as directed.      Historical Provider, MD  omeprazole (PRILOSEC) 40 MG capsule Take 1 capsule (40 mg total) by mouth daily. 10/24/15   Jomarie LongsJade L Breeback, PA-C   Meds Ordered and Administered this Visit  Medications - No data to display  BP 137/99 mmHg  Pulse 93   Temp(Src) 98.2 F (36.8 C) (Oral)  Ht 6\' 1"  (1.854 m)  Wt 272 lb (123.378 kg)  BMI 35.89 kg/m2  SpO2 97% No data found.   Physical Exam  Constitutional: He is oriented to person, place, and time. He appears well-developed and well-nourished.  HENT:  Head: Normocephalic and atraumatic.  Right Ear: External ear normal.  Left Ear: External ear normal.  Mouth/Throat: Oropharynx is clear and moist.  Tender sinuses  Eyes: Conjunctivae and EOM are normal. Pupils are equal, round, and reactive to light.  Neck: Normal range of motion.  Cardiovascular: Normal rate.   Pulmonary/Chest: Effort normal.  Musculoskeletal: Normal range of motion.  Neurological: He is alert and oriented to person, place, and time.  Psychiatric: He has a normal mood and affect.  Nursing note and vitals reviewed.   ED Course  Procedures (including critical care time)  Labs Review Labs Reviewed  POCT INFLUENZA A/B    Imaging Review No results found.   Visual Acuity Review  Right Eye Distance:   Left Eye Distance:   Bilateral Distance:    Right Eye Near:   Left Eye Near:    Bilateral Near:         MDM Pt concerned because of his medical problems and frequent illness.  This illness may be viral however given level of concern I will give pt antibiotic.  He is advised to return if worse.   1. Acute ethmoidal sinusitis, recurrence not specified    Meds ordered this encounter  Medications  . DISCONTD: amoxicillin (AMOXIL) 500 MG capsule    Sig: Take 1 capsule (500 mg total) by mouth 3 (three) times daily.    Dispense:  30 capsule    Refill:  0    Order Specific Question:  Supervising Provider    Answer:  Georgina PillionMASSEY, Dekendrick [[id number]]  . amoxicillin (AMOXIL) 500 MG capsule    Sig: Take 1 capsule (500 mg total) by mouth 3 (three) times daily.    Dispense:  30 capsule    Refill:  0    Order Specific Question:  Supervising Provider    Answer:  Georgina PillionMASSEY, Lateef [[id number]]  An After Visit Summary was printed  and given to the patient.    Lonia SkinnerLeslie K WinfieldSofia, PA-C 11/05/15 [phone]

## 2015-11-05 NOTE — ED Notes (Signed)
Woke up this morning with Congestion, runny nose, ears hurt, sore throat

## 2015-11-05 NOTE — Discharge Instructions (Signed)

## 2015-12-08 ENCOUNTER — Emergency Department (INDEPENDENT_AMBULATORY_CARE_PROVIDER_SITE_OTHER)
Admission: EM | Admit: 2015-12-08 | Discharge: 2015-12-08 | Disposition: A | Discharge status: Home / Self Care | Payer: BLUE CROSS/BLUE SHIELD | Source: Home / Self Care | Attending: Family Medicine | Admitting: Family Medicine

## 2015-12-08 ENCOUNTER — Encounter: Payer: Self-pay | Admitting: *Deleted

## 2015-12-08 DIAGNOSIS — M545 Low back pain, unspecified: Secondary | ICD-10-CM

## 2015-12-08 DIAGNOSIS — M5441 Lumbago with sciatica, right side: Secondary | ICD-10-CM

## 2015-12-08 DIAGNOSIS — G8929 Other chronic pain: Secondary | ICD-10-CM

## 2015-12-08 DIAGNOSIS — Z8739 Personal history of other diseases of the musculoskeletal system and connective tissue: Secondary | ICD-10-CM

## 2015-12-08 HISTORY — DX: Type 2 diabetes mellitus without complications: E11.9

## 2015-12-08 HISTORY — DX: Essential (primary) hypertension: I10

## 2015-12-08 MED ORDER — PREDNISONE 20 MG PO TABS: ORAL_TABLET | ORAL | Status: DC

## 2015-12-08 MED ORDER — METHOCARBAMOL 500 MG PO TABS: 500.0000 mg | ORAL_TABLET | Freq: Two times a day (BID) | ORAL | Status: DC

## 2015-12-08 MED ORDER — MELOXICAM 7.5 MG PO TABS: 15.0000 mg | ORAL_TABLET | Freq: Every day | ORAL | Status: DC

## 2015-12-08 MED ORDER — HYDROCODONE-ACETAMINOPHEN 5-325 MG PO TABS: 1.0000 | ORAL_TABLET | Freq: Four times a day (QID) | ORAL | Status: DC | PRN

## 2015-12-08 MED ORDER — KETOROLAC TROMETHAMINE 60 MG/2ML IM SOLN
60.0000 mg | Freq: Once | INTRAMUSCULAR | Status: AC
  Administered 2015-12-08: 60 mg via INTRAMUSCULAR

## 2015-12-08 NOTE — ED Notes (Signed)
Pt reports h/o back pain due to 3 herniated disc. Pain intermittently flares up. Felt pain coming on 3 days ago. When he sat up in bed this AM he felt more sudden pain. Pain radiates to right leg. No otc meds taken this AM, took Aleve 2 days ago.

## 2015-12-08 NOTE — Discharge Instructions (Signed)
Norco/Vicodin (hydrocodone-acetaminophen) is a narcotic pain medication, do not combine these medications with others containing tylenol. While taking, do not drink alcohol, drive, or perform any other activities that requires focus while taking these medications.   Meloxicam (Mobic) is an antiinflammatory to help with pain and inflammation.  Do not take ibuprofen, Advil, Aleve, or any other medications that contain NSAIDs while taking meloxicam as this may cause stomach upset or even ulcers if taken in large amounts for an extended period of time.   Robaxin is a muscle relaxer and may cause drowsiness. Do not drink alcohol, drive, or operate heavy machinery while taking.

## 2015-12-08 NOTE — ED Provider Notes (Signed)
CSN: [id number]     Arrival date & time 12/08/15  0805 History   First MD Initiated Contact with Patient 12/08/15 0830     Chief Complaint  Patient presents with  . Back Pain   (Consider location/radiation/quality/duration/timing/severity/associated sxs/prior Treatment) HPI The pt is a 40yo male presenting to [facility] with c/o exacerbation of his chronic lower back pain. Pt reports hx of 3 herniated discs in his back that causes flare ups of back pain from time to time. The other day he started to have lower back soreness so he took Aleve with temporary relief, however, this morning when his alarm went off he sat up quickly due to being startled awake, causing him to pull his back again.  Pain is aching and sore, worse with certain movements, 7/10 at worst.  Pain radiates down his Right leg like it normally does.  He attempted to work a few hours this morning but had to leave work due to the severe pain.  He notes he has had Toradol injections before, which have helped as well as prednisone.  Denies new injuries, no heavy lifting or falls.  Denies hx of back surgeries. No change in bowel or bladder habits.   Past Medical History  Diagnosis Date  . Depressed   . Heart murmur     as a child  . Hypothyroidism   . Diabetes mellitus without complication (HCC)   . Hypertension    Past Surgical History  Procedure Laterality Date  . Cholecystectomy     Family History  Problem Relation Age of Onset  . Depression Mother   . Diabetes Mother   . Hypertension Mother   . Hyperlipidemia Mother   . Sleep apnea Maternal Uncle   . Alcohol abuse Maternal Uncle   . Diabetes Maternal Uncle   . Coronary artery disease Maternal Grandfather   . Heart attack Maternal Grandfather   . Alcohol abuse Paternal Grandfather   . Stroke Paternal Grandfather   . Diabetes Maternal Aunt   . Alcohol abuse Paternal Uncle   . Cancer Maternal Grandmother   . Diabetes Maternal Aunt    Social History  Substance Use  Topics  . Smoking status: Former Games developer  . Smokeless tobacco: None     Comment: quit Jan 2010   . Alcohol Use: Yes     Comment: 3 etoh/week    Review of Systems  Constitutional: Negative for fever and chills.  Gastrointestinal: Negative for nausea, vomiting and abdominal pain.  Genitourinary: Negative for dysuria, urgency, frequency, hematuria and flank pain.  Musculoskeletal: Positive for myalgias and back pain. Negative for joint swelling, arthralgias and gait problem.  Skin: Negative for color change, rash and wound.  Neurological: Negative for weakness and numbness.    Allergies  Review of patient's allergies indicates no known allergies.  Home Medications   Prior to Admission medications   Medication Sig Start Date End Date Taking? Authorizing Provider  atorvastatin (LIPITOR) 40 MG tablet Take 1 tablet (40 mg total) by mouth daily. 10/24/15  Yes Jade L Breeback, PA-C  Dapagliflozin-Metformin HCl ER (XIGDUO XR) 04-999 MG TB24 Take 1 tablet by mouth daily. 10/24/15  Yes Jade L Breeback, PA-C  FLUoxetine (PROZAC) 10 MG capsule Take 1 capsule (10 mg total) by mouth daily. 10/24/15  Yes Jade L Breeback, PA-C  Insulin Degludec (TRESIBA FLEXTOUCH) 100 UNIT/ML SOPN Inject 35 Units into the skin at bedtime. 10/28/15  Yes Jade L Breeback, PA-C  levothyroxine (SYNTHROID) 100 MCG tablet Take 1 tablet (  100 mcg total) by mouth daily before breakfast. Before breakfast and apart from other meds. 10/29/15  Yes Jade L Breeback, PA-C  lisinopril (PRINIVIL,ZESTRIL) 5 MG tablet Take 1 tablet (5 mg total) by mouth daily. 10/24/15  Yes Jade L Breeback, PA-C  AMBULATORY NON FORMULARY MEDICATION One touch ultra 2  Testing 2 twice a day. For Diabetes Mellitus type II, uncontrolled. 04/28/15   Jade L Breeback, PA-C  glucose blood test strip 1 each by Other route as directed. Use as instructed     Historical Provider, MD  HYDROcodone-acetaminophen (NORCO/VICODIN) 5-325 MG tablet Take 1 tablet by mouth every 8  (eight) hours as needed for moderate pain. 10/24/15   Jade L Breeback, PA-C  HYDROcodone-acetaminophen (NORCO/VICODIN) 5-325 MG tablet Take 1 tablet by mouth every 6 (six) hours as needed for moderate pain or severe pain. 12/08/15   Junius Finner, PA-C  Insulin Glargine (TOUJEO SOLOSTAR) 300 UNIT/ML SOPN Inject 35 Units into the skin at bedtime. 10/24/15   Jade L Breeback, PA-C  meloxicam (MOBIC) 7.5 MG tablet Take 2 tablets (15 mg total) by mouth daily. For 5 days, then 1-2 tabs (7.5-15mg ) daily as needed for pain. 12/08/15   Junius Finner, PA-C  methocarbamol (ROBAXIN) 500 MG tablet Take 1 tablet (500 mg total) by mouth 2 (two) times daily. 12/08/15   Junius Finner, PA-C  Monolet Lancets MISC by Does not apply route as directed.      Historical Provider, MD  omeprazole (PRILOSEC) 40 MG capsule Take 1 capsule (40 mg total) by mouth daily. 10/24/15   Jade L Breeback, PA-C  predniSONE (DELTASONE) 20 MG tablet 3 tabs po day one, then 2 po daily x 4 days 12/08/15   Junius Finner, PA-C   Meds Ordered and Administered this Visit   Medications  ketorolac (TORADOL) injection 60 mg (60 mg Intramuscular Given 12/08/15 0843)    BP 122/85 mmHg  Pulse 100  Temp(Src) 97.9 F (36.6 C) (Oral)  Wt 274 lb (124.286 kg)  SpO2 97% No data found.   Physical Exam  Constitutional: He is oriented to person, place, and time. He appears well-developed and well-nourished.  HENT:  Head: Normocephalic and atraumatic.  Eyes: EOM are normal.  Neck: Normal range of motion. Neck supple.  Cardiovascular: Normal rate, regular rhythm and normal heart sounds.   Pulmonary/Chest: Effort normal and breath sounds normal. No respiratory distress. He has no wheezes. He has no rales.  Musculoskeletal: Normal range of motion. He exhibits tenderness. He exhibits no edema.  Mild tenderness to lower lumbar spine, tenderness to Right lumbar muscles and buttock.  Positive straight leg raise on the Right. Full ROM upper and lower extremities  with 5/5 strength.   Neurological: He is alert and oriented to person, place, and time.  Normal gait.   Skin: Skin is warm and dry. No rash noted. No erythema.  Psychiatric: He has a normal mood and affect. His behavior is normal.  Nursing note and vitals reviewed.   ED Course  Procedures (including critical care time)  Labs Review Labs Reviewed - No data to display  Imaging Review No results found.    MDM   1. History of herniated intervertebral disc   2. Chronic low back pain   3. Right-sided low back pain with right-sided sciatica    Exacerbation of chronic low back pain w/o known injury. No red flag symptoms.   Tx: Toradol  IM Rx: prednisone, norco, Meloxicam and Robaxin.   Pt may return to work today  with light duty for 3 days. F/u with PCP or Sports Medicine if symptoms keep recurring. Patient verbalized understanding and agreement with treatment plan.     Junius FinnerErin O'Malley, PA-C 12/08/15 [phone]

## 2015-12-11 ENCOUNTER — Other Ambulatory Visit: Payer: Self-pay | Admitting: *Deleted

## 2015-12-11 MED ORDER — FLUOXETINE HCL 10 MG PO CAPS
10.0000 mg | ORAL_CAPSULE | Freq: Every day | ORAL | Status: DC
Start: 2015-12-11 — End: 2016-12-27

## 2016-01-23 ENCOUNTER — Ambulatory Visit: Payer: Self-pay | Admitting: Physician Assistant

## 2016-01-31 ENCOUNTER — Other Ambulatory Visit: Payer: Self-pay | Admitting: Physician Assistant

## 2016-02-02 ENCOUNTER — Other Ambulatory Visit: Payer: Self-pay | Admitting: *Deleted

## 2016-02-02 MED ORDER — DAPAGLIFLOZIN PRO-METFORMIN ER 10-1000 MG PO TB24: 1.0000 | ORAL_TABLET | Freq: Every day | ORAL | Status: DC

## 2016-02-03 ENCOUNTER — Other Ambulatory Visit: Payer: Self-pay | Admitting: Physician Assistant

## 2016-02-14 ENCOUNTER — Other Ambulatory Visit: Payer: Self-pay | Admitting: Physician Assistant

## 2016-02-18 ENCOUNTER — Ambulatory Visit: Payer: Self-pay | Admitting: Physician Assistant

## 2016-03-12 ENCOUNTER — Emergency Department (INDEPENDENT_AMBULATORY_CARE_PROVIDER_SITE_OTHER)
Admission: EM | Admit: 2016-03-12 | Discharge: 2016-03-12 | Disposition: A | Discharge status: Home / Self Care | Payer: BLUE CROSS/BLUE SHIELD | Source: Home / Self Care | Attending: Family Medicine | Admitting: Family Medicine

## 2016-03-12 ENCOUNTER — Encounter: Payer: Self-pay | Admitting: Emergency Medicine

## 2016-03-12 DIAGNOSIS — K0889 Other specified disorders of teeth and supporting structures: Secondary | ICD-10-CM

## 2016-03-12 MED ORDER — CLINDAMYCIN HCL 300 MG PO CAPS: 300.0000 mg | ORAL_CAPSULE | Freq: Three times a day (TID) | ORAL | 0 refills | Status: DC

## 2016-03-12 NOTE — Discharge Instructions (Signed)
May take Ibuprofen 200mg, 4 tabs every 8 hours with food.   If symptoms become significantly worse during the night or over the weekend, proceed to the local emergency room.  

## 2016-03-12 NOTE — ED Provider Notes (Signed)
[facility] We[facility]    CSN: [id number] Arrival date & time: 03/12/16  1259  First Provider Contact:  None       History   Chief Complaint Chief Complaint  Patient presents with  . Dental Pain    HPI Austin Weeks is a 40 y.o. male.   Patient complains of recurring toothache in his right mandible for about 4 months.  About 3 days ago he had swelling in the gingiva of the affected tooth that subsequently drained.  He denies swelling in face or fever.  He has a dental appointment scheduled on 03/17/16.   The history is provided by the patient.  Dental Pain  Location:  Lower Lower teeth location:  31/RL 2nd molar Quality:  Aching Severity:  Mild Onset quality:  Gradual Duration:  3 days Timing:  Intermittent Progression:  Waxing and waning Chronicity:  Chronic Context: abscess   Relieved by:  Nothing Worsened by:  Hot food/drink Ineffective treatments:  NSAIDs Associated symptoms: gum swelling   Associated symptoms: no difficulty swallowing, no facial pain, no facial swelling, no fever, no headaches, no neck pain, no neck swelling, no oral bleeding, no oral lesions and no trismus     Past Medical History:  Diagnosis Date  . Depressed   . Diabetes mellitus without complication (HCC)   . Heart murmur    as a child  . Hypertension   . Hypothyroidism     Patient Active Problem List   Diagnosis Date Noted  . Tooth pain 10/24/2015  . Depression 05/01/2015  . Generalized anxiety disorder 04/28/2015  . Type 2 diabetes mellitus with complication (HCC) 04/28/2015  . Other specified hypothyroidism 04/28/2015  . ESSENTIAL HYPERTENSION, BENIGN 01/27/2010  . Hypothyroidism 01/06/2010  . DM 01/06/2010  . HYPERLIPIDEMIA 01/06/2010  . FATIGUE 01/05/2010  . SHORTNESS OF BREATH 01/05/2010  . CHEST PAIN, LEFT 01/05/2010  . OBESITY, UNSPECIFIED 05/15/2009  . DEPRESSIVE DISORDER NOT ELSEWHERE CLASSIFIED 05/15/2009  . WEIGHT GAIN 05/15/2009  . TOBACCO USE, QUIT  05/15/2009    Past Surgical History:  Procedure Laterality Date  . CHOLECYSTECTOMY         Home Medications    Prior to Admission medications   Medication Sig Start Date End Date Taking? Authorizing Provider  AMBULATORY NON FORMULARY MEDICATION One touch ultra 2  Testing 2 twice a day. For Diabetes Mellitus type II, uncontrolled. 04/28/15   Jade L Breeback, PA-C  atorvastatin (LIPITOR) 40 MG tablet TAKE 1 TABLET (40 MG TOTAL) BY MOUTH DAILY. 02/16/16   Jade L Breeback, PA-C  clindamycin (CLEOCIN) 300 MG capsule Take 1 capsule (300 mg total) by mouth 3 (three) times daily. 03/12/16   Lattie Haw, MD  Dapagliflozin-Metformin HCl ER (XIGDUO XR) 04-999 MG TB24 Take 1 tablet by mouth daily. Please keep f/u appt in July 02/02/16   Jade L Breeback, PA-C  FLUoxetine (PROZAC) 10 MG capsule Take 1 capsule (10 mg total) by mouth daily. 12/11/15   Jade L Breeback, PA-C  glucose blood test strip 1 each by Other route as directed. Use as instructed     Historical Provider, MD  HYDROcodone-acetaminophen (NORCO/VICODIN) 5-325 MG tablet Take 1 tablet by mouth every 6 (six) hours as needed for moderate pain or severe pain. 12/08/15   Junius Finner, PA-C  Insulin Degludec (TRESIBA FLEXTOUCH) 100 UNIT/ML SOPN Inject 35 Units into the skin at bedtime. 10/28/15   Jade L Breeback, PA-C  Insulin Glargine (TOUJEO SOLOSTAR) 300 UNIT/ML SOPN Inject 35 Units into the  skin at bedtime. 10/24/15   Jade L Breeback, PA-C  levothyroxine (SYNTHROID, LEVOTHROID) 100 MCG tablet TAKE 1 TABLET BY MOUTH EVERY DAY BEFORE BREAKFAST AND APART FROM OTHER MEDS 02/16/16   Jade L Breeback, PA-C  lisinopril (PRINIVIL,ZESTRIL) 5 MG tablet Take 1 tablet (5 mg total) by mouth daily. 10/24/15   Jomarie LongsJade L Breeback, PA-C  Monolet Lancets MISC by Does not apply route as directed.      Historical Provider, MD  omeprazole (PRILOSEC) 40 MG capsule Take 1 capsule (40 mg total) by mouth daily. 10/24/15   Jomarie LongsJade L Breeback, PA-C    Family History Family  History  Problem Relation Age of Onset  . Depression Mother   . Diabetes Mother   . Hypertension Mother   . Hyperlipidemia Mother   . Sleep apnea Maternal Uncle   . Alcohol abuse Maternal Uncle   . Diabetes Maternal Uncle   . Coronary artery disease Maternal Grandfather   . Heart attack Maternal Grandfather   . Alcohol abuse Paternal Grandfather   . Stroke Paternal Grandfather   . Diabetes Maternal Aunt   . Alcohol abuse Paternal Uncle   . Cancer Maternal Grandmother   . Diabetes Maternal Aunt     Social History Social History  Substance Use Topics  . Smoking status: Former Games developermoker  . Smokeless tobacco: Not on file     Comment: quit Jan 2010   . Alcohol use Yes     Comment: 3 etoh/week     Allergies   Review of patient's allergies indicates no known allergies.   Review of Systems Review of Systems  Constitutional: Negative for fever.  HENT: Negative for facial swelling and mouth sores.   Musculoskeletal: Negative for neck pain.  Neurological: Negative for headaches.  All other systems reviewed and are negative.    Physical Exam Triage Vital Signs ED Triage Vitals  Enc Vitals Group     BP 03/12/16 1335 (!) 144/104     Pulse Rate 03/12/16 1335 78     Resp --      Temp 03/12/16 1335 97.9 F (36.6 C)     Temp Source 03/12/16 1335 Oral     SpO2 03/12/16 1335 97 %     Weight 03/12/16 1337 263 lb (119.3 kg)     Height 03/12/16 1337 6\' 1"  (1.854 m)     Head Circumference --      Peak Flow --      Pain Score 03/12/16 1339 10     Pain Loc --      Pain Edu? --      Excl. in GC? --    No data found.   Updated Vital Signs BP (!) 144/104 (BP Location: Left Arm)   Pulse 78   Temp 97.9 F (36.6 C) (Oral)   Ht 6\' 1"  (1.854 m)   Wt 263 lb (119.3 kg)   SpO2 97%   BMI 34.70 kg/m   Visual Acuity Right Eye Distance:   Left Eye Distance:   Bilateral Distance:    Right Eye Near:   Left Eye Near:    Bilateral Near:     Physical Exam  Constitutional: He  appears well-developed and well-nourished. No distress.  HENT:  Head: Normocephalic.  Right Ear: External ear normal.  Left Ear: External ear normal.  Nose: Nose normal.  Mouth/Throat: Uvula is midline, oropharynx is clear and moist and mucous membranes are normal. No trismus in the jaw.    There is tenderness to palpation (  but not fluctuance) of gingiva at tooth #31 as noted on diagram.  There is tenderness to tap of the affected tooth.  Eyes: EOM are normal. Pupils are equal, round, and reactive to light.  Neck: Neck supple.  Lymphadenopathy:    He has no cervical adenopathy.  Nursing note and vitals reviewed.    UC Treatments / Results  Labs (all labs ordered are listed, but only abnormal results are displayed) Labs Reviewed - No data to display  EKG  EKG Interpretation None       Radiology No results found.  Procedures Procedures (including critical care time)  Medications Ordered in UC Medications - No data to display   Initial Impression / Assessment and Plan / UC Course  I have reviewed the triage vital signs and the nursing notes.  Pertinent labs & imaging results that were available during my care of the patient were reviewed by me and considered in my medical decision making (see chart for details).  Clinical Course       Final Clinical Impressions(s) / UC Diagnoses   Final diagnoses:  Toothache   Suspect periapical abscess. Begin Clindamycin.  May take Ibuprofen 200mg , 4 tabs every 8 hours with food.  If symptoms become significantly worse during the night or over the weekend, proceed to the local emergency room.  Followup with dentist as scheduled New Prescriptions New Prescriptions   CLINDAMYCIN (CLEOCIN) 300 MG CAPSULE    Take 1 capsule (300 mg total) by mouth 3 (three) times daily.     Lattie HawStephen A Donetta Isaza, MD 03/12/16 [id number]

## 2016-03-12 NOTE — ED Triage Notes (Signed)
Right bottom tooth pain x 2-3 days. Has dentist appt 8/23

## 2016-03-30 ENCOUNTER — Encounter: Payer: Self-pay | Admitting: *Deleted

## 2016-03-30 ENCOUNTER — Emergency Department (INDEPENDENT_AMBULATORY_CARE_PROVIDER_SITE_OTHER)
Admission: EM | Admit: 2016-03-30 | Discharge: 2016-03-30 | Disposition: A | Discharge status: Home / Self Care | Payer: BLUE CROSS/BLUE SHIELD | Source: Home / Self Care | Attending: Family Medicine | Admitting: Family Medicine

## 2016-03-30 DIAGNOSIS — R112 Nausea with vomiting, unspecified: Secondary | ICD-10-CM

## 2016-03-30 DIAGNOSIS — R197 Diarrhea, unspecified: Secondary | ICD-10-CM

## 2016-03-30 MED ORDER — ONDANSETRON 4 MG PO TBDP
4.0000 mg | ORAL_TABLET | Freq: Once | ORAL | Status: AC
  Administered 2016-03-30: 4 mg via ORAL

## 2016-03-30 MED ORDER — PROMETHAZINE HCL 25 MG PO TABS: 25.0000 mg | ORAL_TABLET | Freq: Four times a day (QID) | ORAL | 0 refills | Status: DC | PRN

## 2016-03-30 NOTE — ED Provider Notes (Signed)
CSN: [id number]     Arrival date & time 03/30/16  9528 History   First MD Initiated Contact with Patient 03/30/16 [phone]     Chief Complaint  Patient presents with  . Emesis  . Diarrhea   (Consider location/radiation/quality/duration/timing/severity/associated sxs/prior Treatment) HPI  Austin Weeks is a 40 y.o. male presenting to UC with c/o 2 days of nausea, vomiting and diarrhea. He reports vomiting has improved some since onset of symptoms as he has only had about 3-4 episodes in last 24 hours but he has had about 5-10 episodes of watery diarrhea in last 24 hours. No blood or mucous in stool. Associated generalized abdominal cramping that is mild in severity, hot and cold chills but no recorded fever.  Pt notes his 5yo son just started to have similar symptoms yesterday and his wife stated today she was not feeling well. No recent travel.  Pt has been able to keep down fluids but not food.  He did try Pepto bismol yesterday but no relief.     Past Medical History:  Diagnosis Date  . Depressed   . Diabetes mellitus without complication (HCC)   . Heart murmur    as a child  . Hypertension   . Hypothyroidism    Past Surgical History:  Procedure Laterality Date  . CHOLECYSTECTOMY     Family History  Problem Relation Age of Onset  . Depression Mother   . Diabetes Mother   . Hypertension Mother   . Hyperlipidemia Mother   . Sleep apnea Maternal Uncle   . Alcohol abuse Maternal Uncle   . Diabetes Maternal Uncle   . Coronary artery disease Maternal Grandfather   . Heart attack Maternal Grandfather   . Alcohol abuse Paternal Grandfather   . Stroke Paternal Grandfather   . Diabetes Maternal Aunt   . Alcohol abuse Paternal Uncle   . Cancer Maternal Grandmother   . Diabetes Maternal Aunt    Social History  Substance Use Topics  . Smoking status: Former Games developer  . Smokeless tobacco: Never Used     Comment: quit Jan 2010   . Alcohol use Yes     Comment: 3 etoh/week    Review of  Systems  Constitutional: Positive for appetite change, chills, diaphoresis, fatigue ( mild) and fever ( subjective).  HENT: Negative for congestion, ear pain and sore throat.   Respiratory: Negative for cough and shortness of breath.   Gastrointestinal: Positive for abdominal pain ( mild cramping), diarrhea, nausea and vomiting. Negative for blood in stool and constipation.  Genitourinary: Negative for dysuria, frequency and hematuria.  Musculoskeletal: Negative for arthralgias, back pain and myalgias.  Neurological: Positive for weakness ( generalized). Negative for dizziness, light-headedness and headaches.    Allergies  Review of patient's allergies indicates no known allergies.  Home Medications   Prior to Admission medications   Medication Sig Start Date End Date Taking? Authorizing Provider  AMBULATORY NON FORMULARY MEDICATION One touch ultra 2  Testing 2 twice a day. For Diabetes Mellitus type II, uncontrolled. 04/28/15   Jade L Breeback, PA-C  atorvastatin (LIPITOR) 40 MG tablet TAKE 1 TABLET (40 MG TOTAL) BY MOUTH DAILY. 02/16/16   Jade L Breeback, PA-C  Dapagliflozin-Metformin HCl ER (XIGDUO XR) 04-999 MG TB24 Take 1 tablet by mouth daily. Please keep f/u appt in July 02/02/16   Jade L Breeback, PA-C  FLUoxetine (PROZAC) 10 MG capsule Take 1 capsule (10 mg total) by mouth daily. 12/11/15   Jomarie Longs, PA-C  glucose blood test strip 1 each by Other route as directed. Use as instructed     Historical Provider, MD  Insulin Degludec (TRESIBA FLEXTOUCH) 100 UNIT/ML SOPN Inject 35 Units into the skin at bedtime. 10/28/15   Jade L Breeback, PA-C  Insulin Glargine (TOUJEO SOLOSTAR) 300 UNIT/ML SOPN Inject 35 Units into the skin at bedtime. 10/24/15   Jade L Breeback, PA-C  levothyroxine (SYNTHROID, LEVOTHROID) 100 MCG tablet TAKE 1 TABLET BY MOUTH EVERY DAY BEFORE BREAKFAST AND APART FROM OTHER MEDS 02/16/16   Jade L Breeback, PA-C  lisinopril (PRINIVIL,ZESTRIL) 5 MG tablet Take 1 tablet  (5 mg total) by mouth daily. 10/24/15   Jomarie LongsJade L Breeback, PA-C  Monolet Lancets MISC by Does not apply route as directed.      Historical Provider, MD  omeprazole (PRILOSEC) 40 MG capsule Take 1 capsule (40 mg total) by mouth daily. 10/24/15   Jade L Breeback, PA-C  promethazine (PHENERGAN) 25 MG tablet Take 1 tablet (25 mg total) by mouth every 6 (six) hours as needed for nausea or vomiting. 03/30/16   Junius FinnerErin O'Malley, PA-C   Meds Ordered and Administered this Visit   Medications  ondansetron (ZOFRAN-ODT) disintegrating tablet 4 mg (4 mg Oral Given 03/30/16 0846)    BP 132/87 (BP Location: Left Arm)   Pulse 83   Temp 97.7 F (36.5 C) (Oral)   Wt 263 lb (119.3 kg)   SpO2 97%   BMI 34.70 kg/m  No data found.   Physical Exam  Constitutional: He appears well-developed and well-nourished. No distress.  Pt sitting on exam bed, NAD  HENT:  Head: Normocephalic and atraumatic.  Right Ear: Tympanic membrane normal.  Left Ear: Tympanic membrane normal.  Nose: Nose normal.  Mouth/Throat: Uvula is midline, oropharynx is clear and moist and mucous membranes are normal.  Eyes: Conjunctivae are normal. No scleral icterus.  Neck: Normal range of motion. Neck supple.  Cardiovascular: Normal rate, regular rhythm and normal heart sounds.   Pulmonary/Chest: Effort normal and breath sounds normal. No respiratory distress. He has no wheezes. He has no rales.  Abdominal: Soft. Bowel sounds are normal. He exhibits no distension and no mass. There is no tenderness. There is no rebound and no guarding.  Soft, non-distended, non-tender.  Musculoskeletal: Normal range of motion.  Neurological: He is alert.  Skin: Skin is warm and dry. He is not diaphoretic.  Nursing note and vitals reviewed.   Urgent Care Course   Clinical Course    Procedures (including critical care time)  Labs Review Labs Reviewed - No data to display  Imaging Review No results found.   MDM   1. Nausea vomiting and diarrhea     Pt c/o n/v/d.  Pt appears well, moist mucous membranes.  Vitals: WNL Abdomen- soft, non-distended. Non-tender.  Pt reports family members now also having similar GI symptoms. Symptoms likely viral in nature. Pt is able to keep down fluids.    Rx: Phenergan. Home care instructions provided, encouraged to slowly start the B.R.A.T diet. May take OTC imodium as needed if stools not forming after introducing solid foods in diet. F/u with PCP in 3-4 days if not improving.  Pt would like to return to work tomorrow if possible. Two notes provided, one for pt to return tomorrow and one for Thursday 9/7 if still unwell tomorrow. Patient verbalized understanding and agreement with treatment plan.     Junius Finnerrin O'Malley, PA-C 03/30/16 [phone]

## 2016-03-30 NOTE — Discharge Instructions (Signed)
° °  If you find that your diarrhea is not improving after slowly introducing solid foods, you may try over the counter imodium, however, do not take more than indicated on product packaging.

## 2016-03-30 NOTE — ED Triage Notes (Signed)
Pt c/o 2 days of N/V/D. Chills and sweats yesterday, afebrile, able to hold fluids. Son with similar symptoms.

## 2016-04-16 ENCOUNTER — Other Ambulatory Visit: Payer: Self-pay | Admitting: Physician Assistant

## 2016-05-04 ENCOUNTER — Ambulatory Visit (INDEPENDENT_AMBULATORY_CARE_PROVIDER_SITE_OTHER): Payer: BLUE CROSS/BLUE SHIELD | Admitting: Sports Medicine

## 2016-05-04 ENCOUNTER — Encounter: Payer: Self-pay | Admitting: Sports Medicine

## 2016-05-04 DIAGNOSIS — E118 Type 2 diabetes mellitus with unspecified complications: Secondary | ICD-10-CM

## 2016-05-04 DIAGNOSIS — Z794 Long term (current) use of insulin: Secondary | ICD-10-CM | POA: Diagnosis not present

## 2016-05-04 DIAGNOSIS — K089 Disorder of teeth and supporting structures, unspecified: Secondary | ICD-10-CM | POA: Diagnosis not present

## 2016-05-04 LAB — POCT GLYCOSYLATED HEMOGLOBIN (HGB A1C): Hemoglobin A1C: 12.8

## 2016-05-04 MED ORDER — DULAGLUTIDE 1.5 MG/0.5ML ~~LOC~~ SOAJ: 1.5000 mg | SUBCUTANEOUS | 11 refills | Status: DC

## 2016-05-04 MED ORDER — GLIPIZIDE 5 MG PO TABS: 5.0000 mg | ORAL_TABLET | Freq: Two times a day (BID) | ORAL | 11 refills | Status: DC

## 2016-05-04 MED ORDER — AMOXICILLIN-POT CLAVULANATE 875-125 MG PO TABS: 1.0000 | ORAL_TABLET | Freq: Two times a day (BID) | ORAL | 0 refills | Status: DC

## 2016-05-04 MED ORDER — DAPAGLIFLOZIN PRO-METFORMIN ER 10-1000 MG PO TB24: 1.0000 | ORAL_TABLET | Freq: Every day | ORAL | 3 refills | Status: DC

## 2016-05-04 NOTE — Assessment & Plan Note (Signed)
We are going to redo his entire diabetes regimen. Discontinue insulin, Tresiba 35 units was covered so we can switch back to this if all else fails. Refilling XigDuo max dose, adding glipizide max dose, and adding Trulicity. We can certainly try Victoza or Bydureon if needed. Return to see me in 3 months.

## 2016-05-04 NOTE — Assessment & Plan Note (Signed)
Has a Bridge coming up, adding Augmentin.

## 2016-05-04 NOTE — Progress Notes (Signed)
  Subjective:    CC: Follow-up  HPI: Diabetes mellitus type 2: Austin Weeks returns, he is fairly noncompliant with his diabetes medications, hemoglobin A1c was 10 in the past, he has since run out of his medications.  Dental work: Has a bridge coming up, would like some antibiotics for use beforehand.  Past medical history:  Negative.  See flowsheet/record as well for more information.  Surgical history: Negative.  See flowsheet/record as well for more information.  Family history: Negative.  See flowsheet/record as well for more information.  Social history: Negative.  See flowsheet/record as well for more information.  Allergies, and medications have been entered into the medical record, reviewed, and no changes needed.   Review of Systems: No fevers, chills, night sweats, weight loss, chest pain, or shortness of breath.   Objective:    General: Well Developed, well nourished, and in no acute distress.  Neuro: Alert and oriented x3, extra-ocular muscles intact, sensation grossly intact.  HEENT: Normocephalic, atraumatic, pupils equal round reactive to light, neck supple, no masses, no lymphadenopathy, thyroid nonpalpable.  Skin: Warm and dry, no rashes. Cardiac: Regular rate and rhythm, no murmurs rubs or gallops, no lower extremity edema.  Respiratory: Clear to auscultation bilaterally. Not using accessory muscles, speaking in full sentences.  Impression and Recommendations:    Type 2 diabetes mellitus with complication (HCC) We are going to redo his entire diabetes regimen. Discontinue insulin, Tresiba 35 units was covered so we can switch back to this if all else fails. Refilling XigDuo max dose, adding glipizide max dose, and adding Trulicity. We can certainly try Victoza or Bydureon if needed. Return to see me in 3 months.  Poor dentition Has a Bridge coming up, adding Augmentin.  I spent 25 minutes with this patient, greater than 50% was face-to-face time counseling regarding  the above diagnoses

## 2016-05-16 ENCOUNTER — Other Ambulatory Visit: Payer: Self-pay | Admitting: Physician Assistant

## 2016-05-21 ENCOUNTER — Emergency Department (INDEPENDENT_AMBULATORY_CARE_PROVIDER_SITE_OTHER)
Admission: EM | Admit: 2016-05-21 | Discharge: 2016-05-21 | Disposition: A | Discharge status: Home / Self Care | Payer: BLUE CROSS/BLUE SHIELD | Source: Home / Self Care | Attending: Family Medicine | Admitting: Family Medicine

## 2016-05-21 ENCOUNTER — Encounter: Payer: Self-pay | Admitting: Emergency Medicine

## 2016-05-21 DIAGNOSIS — M5441 Lumbago with sciatica, right side: Secondary | ICD-10-CM

## 2016-05-21 MED ORDER — KETOROLAC TROMETHAMINE 60 MG/2ML IM SOLN
60.0000 mg | Freq: Once | INTRAMUSCULAR | Status: AC
  Administered 2016-05-21: 60 mg via INTRAMUSCULAR

## 2016-05-21 MED ORDER — PREDNISONE 20 MG PO TABS: ORAL_TABLET | ORAL | 0 refills | Status: DC

## 2016-05-21 MED ORDER — METHYLPREDNISOLONE SODIUM SUCC 125 MG IJ SOLR
125.0000 mg | Freq: Once | INTRAMUSCULAR | Status: AC
  Administered 2016-05-21: 125 mg via INTRAMUSCULAR

## 2016-05-21 MED ORDER — HYDROCODONE-ACETAMINOPHEN 5-325 MG PO TABS: ORAL_TABLET | ORAL | 0 refills | Status: DC

## 2016-05-21 MED ORDER — CYCLOBENZAPRINE HCL 10 MG PO TABS: ORAL_TABLET | ORAL | 1 refills | Status: DC

## 2016-05-21 NOTE — Discharge Instructions (Signed)
Begin prednisone Saturday, 05/22/16. Apply ice pack for 20 to 30 minutes, 3 to 4 times daily  Continue until pain decreases. Begin back range of motion and stretching exercises in about 3 to 5 days as tolerated.

## 2016-05-21 NOTE — ED Triage Notes (Signed)
Reports falling off 5 ft ladder 2 days ago and landing on back; now painful with certain motions in mid back area which is where he has had back pain in the past; sometimes radiates down right leg.

## 2016-05-21 NOTE — ED Provider Notes (Signed)
[facility]    CSN: [id number] Arrival date & time: 05/21/16  1014     History   Chief Complaint Chief Complaint  Patient presents with  . Back Pain    HPI Austin Weeks is a 40 y.o. male.   Patient states that he was trimming a tree two days ago while standing on a short 5 feet ladder.  He lost his balance and fell backwards, landing on his back.  He has had persistent low back ache radiating to his right leg.   He denies bowel or bladder dysfunction, and no saddle numbness.  He has a history of similar back pain.   The history is provided by the patient.  Back Pain  Location:  Lumbar spine Quality:  Aching Radiates to:  R posterior upper leg Pain severity:  Moderate Pain is:  Same all the time Onset quality:  Sudden Duration:  2 days Timing:  Intermittent Progression:  Unchanged Chronicity:  New Context: falling   Relieved by: sitting. Exacerbated by: rapid movements. Ineffective treatments:  NSAIDs and heating pad Associated symptoms: paresthesias   Associated symptoms: no abdominal pain, no bladder incontinence, no bowel incontinence, no dysuria, no fever, no headaches, no leg pain, no numbness, no pelvic pain, no perianal numbness, no tingling, no weakness and no weight loss     Past Medical History:  Diagnosis Date  . Depressed   . Diabetes mellitus without complication (HCC)   . Heart murmur    as a child  . Hypertension   . Hypothyroidism     Patient Active Problem List   Diagnosis Date Noted  . Poor dentition 05/04/2016  . Tooth pain 10/24/2015  . Depression 05/01/2015  . Generalized anxiety disorder 04/28/2015  . Type 2 diabetes mellitus with complication (HCC) 04/28/2015  . Other specified hypothyroidism 04/28/2015  . ESSENTIAL HYPERTENSION, BENIGN 01/27/2010  . Hypothyroidism 01/06/2010  . DM 01/06/2010  . HYPERLIPIDEMIA 01/06/2010  . FATIGUE 01/05/2010  . SHORTNESS OF BREATH 01/05/2010  . CHEST PAIN, LEFT 01/05/2010  .  OBESITY, UNSPECIFIED 05/15/2009  . DEPRESSIVE DISORDER NOT ELSEWHERE CLASSIFIED 05/15/2009  . WEIGHT GAIN 05/15/2009  . TOBACCO USE, QUIT 05/15/2009    Past Surgical History:  Procedure Laterality Date  . CHOLECYSTECTOMY         Home Medications    Prior to Admission medications   Medication Sig Start Date End Date Taking? Authorizing Provider  AMBULATORY NON FORMULARY MEDICATION One touch ultra 2  Testing 2 twice a day. For Diabetes Mellitus type II, uncontrolled. 04/28/15   Jade L Breeback, PA-C  amoxicillin-clavulanate (AUGMENTIN) 875-125 MG tablet Take 1 tablet by mouth 2 (two) times daily. 05/04/16   Monica Becton, MD  atorvastatin (LIPITOR) 40 MG tablet TAKE 1 TABLET (40 MG TOTAL) BY MOUTH DAILY. 05/17/16   Jade L Breeback, PA-C  cyclobenzaprine (FLEXERIL) 10 MG tablet Take one tab PO HS for muscle spasm 05/21/16   Lattie Haw, MD  Dapagliflozin-Metformin HCl ER (XIGDUO XR) 04-999 MG TB24 Take 1 tablet by mouth daily. 05/04/16   Monica Becton, MD  Dulaglutide (TRULICITY) 1.5 MG/0.5ML SOPN Inject 1.5 mg into the skin once a week. 05/04/16   Monica Becton, MD  FLUoxetine (PROZAC) 10 MG capsule Take 1 capsule (10 mg total) by mouth daily. 12/11/15   Jade L Breeback, PA-C  glipiZIDE (GLUCOTROL) 5 MG tablet Take 1 tablet (5 mg total) by mouth 2 (two) times daily before a meal. 05/04/16   Ihor Austin  Thekkekandam, MD  glucose blood test strip 1 each by Other route as directed. Use as instructed     Historical Provider, MD  HYDROcodone-acetaminophen (NORCO/VICODIN) 5-325 MG tablet Take one by mouth at bedtime as needed for pain 05/21/16   Lattie Haw, MD  levothyroxine (SYNTHROID, LEVOTHROID) 100 MCG tablet TAKE 1 TABLET BY MOUTH EVERY DAY BEFORE BREAKFAST AND APART FROM OTHER MEDS 05/17/16   Jade L Breeback, PA-C  lisinopril (PRINIVIL,ZESTRIL) 5 MG tablet TAKE 1 TABLET (5 MG TOTAL) BY MOUTH DAILY. 04/16/16   Jomarie Longs, PA-C  Monolet Lancets MISC by  Does not apply route as directed.      Historical Provider, MD  omeprazole (PRILOSEC) 40 MG capsule Take 1 capsule (40 mg total) by mouth daily. 10/24/15   Jade L Breeback, PA-C  predniSONE (DELTASONE) 20 MG tablet Take one tab by mouth twice daily for 5 days, then one daily for 3 days. Take with food. 05/21/16   Lattie Haw, MD    Family History Family History  Problem Relation Age of Onset  . Depression Mother   . Diabetes Mother   . Hypertension Mother   . Hyperlipidemia Mother   . Sleep apnea Maternal Uncle   . Alcohol abuse Maternal Uncle   . Diabetes Maternal Uncle   . Coronary artery disease Maternal Grandfather   . Heart attack Maternal Grandfather   . Alcohol abuse Paternal Grandfather   . Stroke Paternal Grandfather   . Diabetes Maternal Aunt   . Alcohol abuse Paternal Uncle   . Cancer Maternal Grandmother   . Diabetes Maternal Aunt     Social History Social History  Substance Use Topics  . Smoking status: Former Games developer  . Smokeless tobacco: Never Used     Comment: quit Jan 2010   . Alcohol use Yes     Comment: 3 etoh/week     Allergies   Review of patient's allergies indicates no known allergies.   Review of Systems Review of Systems  Constitutional: Negative for fever and weight loss.  Gastrointestinal: Negative for abdominal pain and bowel incontinence.  Genitourinary: Negative for bladder incontinence, dysuria and pelvic pain.  Musculoskeletal: Positive for back pain.  Neurological: Positive for paresthesias. Negative for tingling, weakness, numbness and headaches.  All other systems reviewed and are negative.    Physical Exam Triage Vital Signs ED Triage Vitals  Enc Vitals Group     BP 05/21/16 1058 119/80     Pulse --      Resp 05/21/16 1058 (!) 80     Temp 05/21/16 1058 97.5 F (36.4 C)     Temp Source 05/21/16 1058 Oral     SpO2 05/21/16 1058 97 %     Weight 05/21/16 1059 250 lb (113.4 kg)     Height 05/21/16 1059 6\' 1"  (1.854 m)      Head Circumference --      Peak Flow --      Pain Score 05/21/16 1100 7     Pain Loc --      Pain Edu? --      Excl. in GC? --    No data found.   Updated Vital Signs BP 119/80 (BP Location: Left Arm)   Temp 97.5 F (36.4 C) (Oral)   Resp (!) 80   Ht 6\' 1"  (1.854 m)   Wt 250 lb (113.4 kg)   SpO2 97%   BMI 32.98 kg/m   Visual Acuity Right Eye Distance:   Left  Eye Distance:   Bilateral Distance:    Right Eye Near:   Left Eye Near:    Bilateral Near:     Physical Exam  Constitutional: He appears well-developed and well-nourished. No distress.  HENT:  Head: Atraumatic.  Nose: Nose normal.  Mouth/Throat: Oropharynx is clear and moist.  Eyes: Conjunctivae are normal. Pupils are equal, round, and reactive to light.  Neck: Normal range of motion.  Cardiovascular: Normal heart sounds.   Pulmonary/Chest: Breath sounds normal.  Abdominal: There is no tenderness.  Musculoskeletal: He exhibits no edema.       Lumbar back: He exhibits decreased range of motion and tenderness. He exhibits no bony tenderness and no swelling.       Back:  Back:  Range of motion decreased.  Can heel/toe walk and squat without difficulty. Tenderness in the midline and bilateral paraspinous muscles as noted on diagram.  Straight leg raising test is negative.  Sitting knee extension test is negative.  Strength and sensation in the lower extremities is normal.  Patellar and achilles reflexes are normal   Neurological: He is alert. He has normal reflexes.  Skin: Skin is warm and dry.  Nursing note and vitals reviewed.    UC Treatments / Results  Labs (all labs ordered are listed, but only abnormal results are displayed) Labs Reviewed - No data to display  EKG  EKG Interpretation None       Radiology No results found.  Procedures Procedures (including critical care time)  Medications Ordered in UC Medications  ketorolac (TORADOL) injection 60 mg (60 mg Intramuscular Given 05/21/16  1102)  methylPREDNISolone sodium succinate (SOLU-MEDROL) 125 mg/2 mL injection 125 mg (125 mg Intramuscular Given 05/21/16 1150)     Initial Impression / Assessment and Plan / UC Course  I have reviewed the triage vital signs and the nursing notes.  Pertinent labs & imaging results that were available during my care of the patient were reviewed by me and considered in my medical decision making (see chart for details).  Clinical Course  Administered Solumedrol 125mg  IM.  Begin prednisone burst/taper. Rx for Flexeril 10mg  HS.  Rx for Lortab HS prn. Begin prednisone Saturday, 05/22/16. Apply ice pack for 20 to 30 minutes, 3 to 4 times daily  Continue until pain decreases. Begin back range of motion and stretching exercises in about 3 to 5 days as tolerated. Followup with Dr. Rodney Langtonhomas Thekkekandam or Dr. Clementeen GrahamEvan [facility] ([facility]) if not improving about ten days.     Final Clinical Impressions(s) / UC Diagnoses   Final diagnoses:  Bilateral low back pain with right-sided sciatica, unspecified chronicity    New Prescriptions New Prescriptions   CYCLOBENZAPRINE (FLEXERIL) 10 MG TABLET    Take one tab PO HS for muscle spasm   HYDROCODONE-ACETAMINOPHEN (NORCO/VICODIN) 5-325 MG TABLET    Take one by mouth at bedtime as needed for pain   PREDNISONE (DELTASONE) 20 MG TABLET    Take one tab by mouth twice daily for 5 days, then one daily for 3 days. Take with food.     Lattie HawStephen A Beese, MD 05/30/16 [phone]

## 2016-06-21 ENCOUNTER — Emergency Department (INDEPENDENT_AMBULATORY_CARE_PROVIDER_SITE_OTHER)
Admission: EM | Admit: 2016-06-21 | Discharge: 2016-06-21 | Disposition: A | Discharge status: Home / Self Care | Payer: BLUE CROSS/BLUE SHIELD | Source: Home / Self Care | Attending: Emergency Medicine | Admitting: Emergency Medicine

## 2016-06-21 ENCOUNTER — Encounter: Payer: Self-pay | Admitting: *Deleted

## 2016-06-21 DIAGNOSIS — R509 Fever, unspecified: Secondary | ICD-10-CM | POA: Diagnosis not present

## 2016-06-21 DIAGNOSIS — J0100 Acute maxillary sinusitis, unspecified: Secondary | ICD-10-CM

## 2016-06-21 DIAGNOSIS — J039 Acute tonsillitis, unspecified: Secondary | ICD-10-CM

## 2016-06-21 DIAGNOSIS — J029 Acute pharyngitis, unspecified: Secondary | ICD-10-CM | POA: Diagnosis not present

## 2016-06-21 LAB — POCT INFLUENZA A/B
Influenza A, POC: NEGATIVE
Influenza B, POC: NEGATIVE

## 2016-06-21 LAB — POCT RAPID STREP A (OFFICE): Rapid Strep A Screen: NEGATIVE

## 2016-06-21 MED ORDER — CEFDINIR 300 MG PO CAPS: 300.0000 mg | ORAL_CAPSULE | Freq: Two times a day (BID) | ORAL | 0 refills | Status: DC

## 2016-06-21 NOTE — ED Triage Notes (Signed)
Since yesterday pt has experienced sore throat, cough and nasal congestion. Last night hot/cold chills.

## 2016-06-21 NOTE — ED Provider Notes (Signed)
[facility]    CSN: [id number] Arrival date & time: 06/21/16  1827     History   Chief Complaint Chief Complaint  Patient presents with  . Sore Throat    HPI Austin Weeks is a 40 y.o. male.   HPI FLU  HPI : Flu symptoms for about 1-2 days. Fever to 102 with chills, sweats, myalgias, fatigue, headache. Symptoms are progressively worsening, despite trying OTC fever reducing medicine and rest and fluids. Has decreased appetite, but tolerating some liquids by mouth. No history of recent tick bite.  he had a flu shot this fall  Review of Systems: Positive for fatigue, + nasal congestion with yellow rhinorrhea, + severe sore throat,+ swollen anterior neck glands, minimal cough. Negative for acute vision changes, stiff neck, focal weakness, syncope, seizures, respiratory distress, vomiting, diarrhea, GU symptoms, new rash. Positive for bilateral facial pain over maxillary sinuses. Has pressure in both ears without drainage.  Past Medical History:  Diagnosis Date  . Depressed   . Diabetes mellitus without complication (HCC)   . Heart murmur    as a child  . Hypertension   . Hypothyroidism     Patient Active Problem List   Diagnosis Date Noted  . Poor dentition 05/04/2016  . Tooth pain 10/24/2015  . Depression 05/01/2015  . Generalized anxiety disorder 04/28/2015  . Type 2 diabetes mellitus with complication (HCC) 04/28/2015  . Other specified hypothyroidism 04/28/2015  . ESSENTIAL HYPERTENSION, BENIGN 01/27/2010  . Hypothyroidism 01/06/2010  . DM 01/06/2010  . HYPERLIPIDEMIA 01/06/2010  . FATIGUE 01/05/2010  . SHORTNESS OF BREATH 01/05/2010  . CHEST PAIN, LEFT 01/05/2010  . OBESITY, UNSPECIFIED 05/15/2009  . DEPRESSIVE DISORDER NOT ELSEWHERE CLASSIFIED 05/15/2009  . WEIGHT GAIN 05/15/2009  . TOBACCO USE, QUIT 05/15/2009    Past Surgical History:  Procedure Laterality Date  . CHOLECYSTECTOMY         Home Medications    Prior to Admission  medications   Medication Sig Start Date End Date Taking? Authorizing Provider  AMBULATORY NON FORMULARY MEDICATION One touch ultra 2  Testing 2 twice a day. For Diabetes Mellitus type II, uncontrolled. 04/28/15   Jade L Breeback, PA-C  atorvastatin (LIPITOR) 40 MG tablet TAKE 1 TABLET (40 MG TOTAL) BY MOUTH DAILY. 05/17/16   Jade L Breeback, PA-C  cefdinir (OMNICEF) 300 MG capsule Take 1 capsule (300 mg total) by mouth 2 (two) times daily. X 10 days 06/21/16   Lajean Manesavid Massey, MD  cyclobenzaprine (FLEXERIL) 10 MG tablet Take one tab PO HS for muscle spasm 05/21/16   Lattie HawStephen A Beese, MD  Dapagliflozin-Metformin HCl ER (XIGDUO XR) 04-999 MG TB24 Take 1 tablet by mouth daily. 05/04/16   Monica Bectonhomas J Thekkekandam, MD  Dulaglutide (TRULICITY) 1.5 MG/0.5ML SOPN Inject 1.5 mg into the skin once a week. 05/04/16   Monica Bectonhomas J Thekkekandam, MD  FLUoxetine (PROZAC) 10 MG capsule Take 1 capsule (10 mg total) by mouth daily. 12/11/15   Jade L Breeback, PA-C  glipiZIDE (GLUCOTROL) 5 MG tablet Take 1 tablet (5 mg total) by mouth 2 (two) times daily before a meal. 05/04/16   Monica Bectonhomas J Thekkekandam, MD  glucose blood test strip 1 each by Other route as directed. Use as instructed     Historical Provider, MD  HYDROcodone-acetaminophen (NORCO/VICODIN) 5-325 MG tablet Take one by mouth at bedtime as needed for pain 05/21/16   Lattie HawStephen A Beese, MD  levothyroxine (SYNTHROID, LEVOTHROID) 100 MCG tablet TAKE 1 TABLET BY MOUTH EVERY DAY BEFORE BREAKFAST  AND APART FROM OTHER MEDS 05/17/16   Jade L Breeback, PA-C  lisinopril (PRINIVIL,ZESTRIL) 5 MG tablet TAKE 1 TABLET (5 MG TOTAL) BY MOUTH DAILY. 04/16/16   Jomarie Longs, PA-C  Monolet Lancets MISC by Does not apply route as directed.      Historical Provider, MD  omeprazole (PRILOSEC) 40 MG capsule Take 1 capsule (40 mg total) by mouth daily. 10/24/15   Jade L Breeback, PA-C  predniSONE (DELTASONE) 20 MG tablet Take one tab by mouth twice daily for 5 days, then one daily for 3 days.  Take with food. 05/21/16   Lattie Haw, MD    Family History Family History  Problem Relation Age of Onset  . Depression Mother   . Diabetes Mother   . Hypertension Mother   . Hyperlipidemia Mother   . Sleep apnea Maternal Uncle   . Alcohol abuse Maternal Uncle   . Diabetes Maternal Uncle   . Coronary artery disease Maternal Grandfather   . Heart attack Maternal Grandfather   . Alcohol abuse Paternal Grandfather   . Stroke Paternal Grandfather   . Diabetes Maternal Aunt   . Alcohol abuse Paternal Uncle   . Cancer Maternal Grandmother   . Diabetes Maternal Aunt     Social History Social History  Substance Use Topics  . Smoking status: Former Games developer  . Smokeless tobacco: Never Used     Comment: quit Jan 2010   . Alcohol use Yes     Comment: 3 etoh/week     Allergies   Patient has no known allergies.   Review of Systems Review of Systems  All other systems reviewed and are negative.  See history of present illness above  Physical Exam Triage Vital Signs ED Triage Vitals  Enc Vitals Group     BP 06/21/16 1900 (!) 150/104     Pulse Rate 06/21/16 1900 90     Resp 06/21/16 1900 16     Temp 06/21/16 1900 98.3 F (36.8 C)     Temp Source 06/21/16 1900 Oral     SpO2 06/21/16 1900 98 %     Weight 06/21/16 1901 255 lb (115.7 kg)     Height --      Head Circumference --      Peak Flow --      Pain Score 06/21/16 1902 0     Pain Loc --      Pain Edu? --      Excl. in GC? --    No data found.   Updated Vital Signs BP (!) 150/104 (BP Location: Left Arm)   Pulse 90   Temp 98.3 F (36.8 C) (Oral)   Resp 16   Wt 255 lb (115.7 kg)   SpO2 98%   BMI 33.64 kg/m   Visual Acuity Right Eye Distance:   Left Eye Distance:   Bilateral Distance:    Right Eye Near:   Left Eye Near:    Bilateral Near:     Physical Exam  Constitutional: He appears well-developed and well-nourished.  Non-toxic appearance. He appears ill (very fatigued, but no  cardiorespiratory distress). No distress.  HENT:  Head: Normocephalic and atraumatic.  Right Ear: Tympanic membrane and external ear normal.  Left Ear: Tympanic membrane and external ear normal.  Nose: Rhinorrhea present.  Mouth/Throat: Mucous membranes are normal. Posterior oropharyngeal erythema (Very red) present. No oropharyngeal exudate or posterior oropharyngeal edema.  Tonsils 1+ and very red bilaterally. No exudate. Airway intact. +very tender over  both maxillary sinuses  Eyes: Conjunctivae are normal. Right eye exhibits no discharge. Left eye exhibits no discharge. No scleral icterus.  Neck: Neck supple.  Tender bilateral anterior cervical nodes  Cardiovascular: Normal rate, regular rhythm and normal heart sounds.   Pulmonary/Chest: Breath sounds normal. No stridor. No respiratory distress. He has no wheezes. He has no rales.  Abdominal: Soft. There is no tenderness.  Musculoskeletal: He exhibits no edema.  Lymphadenopathy:    He has cervical adenopathy (mild shoddy anterior cervical nodes).  Neurological: He is alert.  Skin: Skin is warm and intact. No rash noted. He is diaphoretic.  Psychiatric: He has a normal mood and affect.  Nursing note and vitals reviewed.    UC Treatments / Results  Labs (all labs ordered are listed, but only abnormal results are displayed) Labs Reviewed  STREP A DNA PROBE  POCT RAPID STREP A (OFFICE)  POCT INFLUENZA A/B  Rapid strep test negative. Strep a DNA probe sent off to reference lab Rapid flu test negative EKG  EKG Interpretation None       Radiology No results found.  Procedures Procedures (including critical Weeks time)  Medications Ordered in UC Medications - No data to display   Initial Impression / Assessment and Plan / UC Course  I have reviewed the triage vital signs and the nursing notes.  Pertinent labs & imaging results that were available during my Weeks of the patient were reviewed by me and considered in my  medical decision making (see chart for details).  Clinical Course     Final Clinical Impressions(s) / UC Diagnoses   Final diagnoses:  Febrile illness, acute  Acute maxillary sinusitis, recurrence not specified  Tonsillitis   Treatment options discussed, as well as risks, benefits, alternatives. Patient voiced understanding and agreement with the following plans: Omnicef. Mucinex. Other symptomatic Weeks discussed. Follow-up with your primary Weeks doctor in 5-7 days if not improving, or sooner if symptoms become worse. Precautions discussed. Red flags discussed. Questions invited and answered. Patient voiced understanding and agreement.  New Prescriptions New Prescriptions   CEFDINIR (OMNICEF) 300 MG CAPSULE    Take 1 capsule (300 mg total) by mouth 2 (two) times daily. X 10 days     Lajean Manesavid Massey, MD 06/21/16 1949

## 2016-06-22 ENCOUNTER — Telehealth: Payer: Self-pay | Admitting: *Deleted

## 2016-06-22 LAB — STREP A DNA PROBE: GASP: NOT DETECTED

## 2016-06-22 NOTE — Telephone Encounter (Signed)
Spoke to pt given Tcx results. Call back if he has any questions or concerns.

## 2016-07-18 ENCOUNTER — Other Ambulatory Visit: Payer: Self-pay | Admitting: Physician Assistant

## 2016-07-27 ENCOUNTER — Other Ambulatory Visit: Payer: Self-pay | Admitting: Physician Assistant

## 2016-08-21 ENCOUNTER — Other Ambulatory Visit: Payer: Self-pay | Admitting: Physician Assistant

## 2016-09-07 ENCOUNTER — Other Ambulatory Visit: Payer: Self-pay | Admitting: Sports Medicine

## 2016-09-08 ENCOUNTER — Other Ambulatory Visit: Payer: Self-pay | Admitting: *Deleted

## 2016-09-08 DIAGNOSIS — Z794 Long term (current) use of insulin: Secondary | ICD-10-CM

## 2016-09-08 DIAGNOSIS — E118 Type 2 diabetes mellitus with unspecified complications: Secondary | ICD-10-CM

## 2016-09-08 MED ORDER — GLIPIZIDE 5 MG PO TABS: 5.0000 mg | ORAL_TABLET | Freq: Two times a day (BID) | ORAL | 0 refills | Status: DC

## 2016-09-09 ENCOUNTER — Other Ambulatory Visit: Payer: Self-pay | Admitting: Physician Assistant

## 2016-09-15 ENCOUNTER — Other Ambulatory Visit: Payer: Self-pay

## 2016-09-15 DIAGNOSIS — E118 Type 2 diabetes mellitus with unspecified complications: Secondary | ICD-10-CM

## 2016-09-15 DIAGNOSIS — Z794 Long term (current) use of insulin: Secondary | ICD-10-CM

## 2016-09-22 ENCOUNTER — Other Ambulatory Visit: Payer: Self-pay | Admitting: Physician Assistant

## 2016-10-01 ENCOUNTER — Emergency Department: Payer: BLUE CROSS/BLUE SHIELD

## 2016-10-01 ENCOUNTER — Emergency Department (INDEPENDENT_AMBULATORY_CARE_PROVIDER_SITE_OTHER)
Admission: EM | Admit: 2016-10-01 | Discharge: 2016-10-01 | Disposition: A | Discharge status: Home / Self Care | Payer: BLUE CROSS/BLUE SHIELD | Source: Home / Self Care | Attending: Family Medicine | Admitting: Family Medicine

## 2016-10-01 ENCOUNTER — Encounter: Payer: Self-pay | Admitting: *Deleted

## 2016-10-01 ENCOUNTER — Emergency Department (INDEPENDENT_AMBULATORY_CARE_PROVIDER_SITE_OTHER): Payer: BLUE CROSS/BLUE SHIELD

## 2016-10-01 DIAGNOSIS — S0590XA Unspecified injury of unspecified eye and orbit, initial encounter: Secondary | ICD-10-CM | POA: Diagnosis not present

## 2016-10-01 DIAGNOSIS — S0592XA Unspecified injury of left eye and orbit, initial encounter: Secondary | ICD-10-CM | POA: Diagnosis not present

## 2016-10-01 DIAGNOSIS — W2107XA Struck by softball, initial encounter: Secondary | ICD-10-CM

## 2016-10-01 DIAGNOSIS — S0512XA Contusion of eyeball and orbital tissues, left eye, initial encounter: Secondary | ICD-10-CM | POA: Diagnosis not present

## 2016-10-01 DIAGNOSIS — S0990XA Unspecified injury of head, initial encounter: Secondary | ICD-10-CM

## 2016-10-01 DIAGNOSIS — S0993XA Unspecified injury of face, initial encounter: Secondary | ICD-10-CM

## 2016-10-01 MED ORDER — IBUPROFEN 600 MG PO TABS
600.0000 mg | ORAL_TABLET | Freq: Once | ORAL | Status: AC
  Administered 2016-10-01: 600 mg via ORAL

## 2016-10-01 NOTE — ED Provider Notes (Signed)
CSN: [id number]     Arrival date & time 10/01/16  1050 History   First MD Initiated Contact with Patient 10/01/16 1115     Chief Complaint  Patient presents with  . Eye Injury  . Headache   (Consider location/radiation/quality/duration/timing/severity/associated sxs/prior Treatment) HPI  LOYE VENTO is a 41 y.o. male presenting to UC with c/o Left sided facial pain around his eye and generalized headache for 2 days after being hit in the eye with a softball playing catcher while daughter was practicing "windmill" fast pitches.  Denies LOC but notes he saw black in the Left eye and had swelling so bad that evening he could not open his eye.  Pain is aching and sore, occasionally sharp with associated photophobia.  He has taken ibuprofen with mild temporary relief.  Denies vomiting but has had mild nausea. No prior hx of concussions.  He is not on blood thinners.       Past Medical History:  Diagnosis Date  . Depressed   . Diabetes mellitus without complication (HCC)   . Heart murmur    as a child  . Hypertension   . Hypothyroidism    Past Surgical History:  Procedure Laterality Date  . CHOLECYSTECTOMY     Family History  Problem Relation Age of Onset  . Depression Mother   . Diabetes Mother   . Hypertension Mother   . Hyperlipidemia Mother   . Sleep apnea Maternal Uncle   . Alcohol abuse Maternal Uncle   . Diabetes Maternal Uncle   . Coronary artery disease Maternal Grandfather   . Heart attack Maternal Grandfather   . Alcohol abuse Paternal Grandfather   . Stroke Paternal Grandfather   . Diabetes Maternal Aunt   . Alcohol abuse Paternal Uncle   . Cancer Maternal Grandmother   . Diabetes Maternal Aunt    Social History  Substance Use Topics  . Smoking status: Former Games developer  . Smokeless tobacco: Never Used     Comment: quit Jan 2010   . Alcohol use Yes     Comment: 3 etoh/week    Review of Systems  HENT: Positive for facial swelling. Negative for dental problem.     Eyes: Positive for photophobia, pain and visual disturbance. Negative for discharge, redness and itching.  Gastrointestinal: Negative for diarrhea, nausea and vomiting.  Musculoskeletal: Positive for myalgias and neck pain. Negative for back pain and neck stiffness.  Skin: Positive for color change and wound. Negative for rash.  Neurological: Positive for headaches. Negative for dizziness and light-headedness.    Allergies  Patient has no known allergies.  Home Medications   Prior to Admission medications   Medication Sig Start Date End Date Taking? Authorizing Provider  AMBULATORY NON FORMULARY MEDICATION One touch ultra 2  Testing 2 twice a day. For Diabetes Mellitus type II, uncontrolled. 04/28/15   Jade L Breeback, PA-C  atorvastatin (LIPITOR) 40 MG tablet Take 1 tablet (40 mg total) by mouth daily at 6 PM. MUST make appointment for future refills. 09/09/16   Jade L Breeback, PA-C  cyclobenzaprine (FLEXERIL) 10 MG tablet Take one tab PO HS for muscle spasm 05/21/16   Lattie Haw, MD  Dapagliflozin-Metformin HCl ER (XIGDUO XR) 04-999 MG TB24 Take 1 tablet by mouth daily. 05/04/16   Monica Becton, MD  Dulaglutide (TRULICITY) 1.5 MG/0.5ML SOPN Inject 1.5 mg into the skin once a week. 05/04/16   Monica Becton, MD  FLUoxetine (PROZAC) 10 MG capsule Take 1 capsule (10  mg total) by mouth daily. 12/11/15   Jade L Breeback, PA-C  glipiZIDE (GLUCOTROL) 5 MG tablet Take 1 tablet (5 mg total) by mouth 2 (two) times daily before a meal. Needs appointment for future refills. 09/08/16   Jade L Breeback, PA-C  glucose blood test strip 1 each by Other route as directed. Use as instructed     Historical Provider, MD  HYDROcodone-acetaminophen (NORCO/VICODIN) 5-325 MG tablet Take one by mouth at bedtime as needed for pain 05/21/16   Lattie Haw, MD  levothyroxine (SYNTHROID, LEVOTHROID) 100 MCG tablet Take 1 tablet (100 mcg total) by mouth daily before breakfast. MUST make  appointment for future refills. 09/09/16   Jade L Breeback, PA-C  lisinopril (PRINIVIL,ZESTRIL) 5 MG tablet Take 1 tablet (5 mg total) by mouth daily. NEED FOLLOW UP APPOINTMENT FOR MORE REFILLS 08/24/16   Jomarie Longs, PA-C  Monolet Lancets MISC by Does not apply route as directed.      Historical Provider, MD  omeprazole (PRILOSEC) 40 MG capsule Take 1 capsule (40 mg total) by mouth daily. NEED FOLLOW UP VISIT WITH PCP FOR MORE REFILLS 07/20/16   Jomarie Longs, PA-C   Meds Ordered and Administered this Visit   Medications  ibuprofen (ADVIL,MOTRIN) tablet 600 mg (600 mg Oral Given 10/01/16 1127)    BP 136/95 (BP Location: Left Arm)   Pulse 82   Temp 98 F (36.7 C) (Oral)   Wt 254 lb (115.2 kg)   SpO2 98%   BMI 33.51 kg/m  No data found.   Physical Exam  Constitutional: He is oriented to person, place, and time. He appears well-developed and well-nourished. No distress.  HENT:  Head: Normocephalic and atraumatic.  Right Ear: Tympanic membrane normal.  Left Ear: Tympanic membrane normal.  Nose: No sinus tenderness, nasal deformity, septal deviation or nasal septal hematoma. Left sinus exhibits maxillary sinus tenderness. Left sinus exhibits no frontal sinus tenderness.    Mouth/Throat: Uvula is midline, oropharynx is clear and moist and mucous membranes are normal. No trismus in the jaw.  Left eye: ecchymosis surrounding eye, superficial abrasion below Left eye. Mild edema.  Tenderness over Left maxillary sinus and zygomatic bone. No obvious deformity or crepitus.  Eyes: Conjunctivae and EOM are normal. Pupils are equal, round, and reactive to light. Left eye exhibits no discharge.  Neck: Normal range of motion. Neck supple.  Cardiovascular: Normal rate and regular rhythm.   Pulmonary/Chest: Effort normal. No respiratory distress.  Musculoskeletal: Normal range of motion.  Neurological: He is alert and oriented to person, place, and time.  Skin: Skin is warm and dry. He is not  diaphoretic.  Psychiatric: He has a normal mood and affect. His behavior is normal.  Nursing note and vitals reviewed.   Urgent Care Course     Procedures (including critical care time)  Labs Review Labs Reviewed - No data to display  Imaging Review Ct Head Wo Contrast  Result Date: 10/01/2016 CLINICAL DATA:  41 year old male with eye trauma EXAM: CT HEAD WITHOUT CONTRAST CT MAXILLOFACIAL WITHOUT CONTRAST TECHNIQUE: Multidetector CT imaging of the head and maxillofacial structures were performed using the standard protocol without intravenous contrast. Multiplanar CT image reconstructions of the maxillofacial structures were also generated. COMPARISON:  None. FINDINGS: CT HEAD FINDINGS Brain: No acute intracranial hemorrhage. No midline shift or mass effect. Gray-white differentiation maintained. Unremarkable appearance of the ventricular system. Vascular: Unremarkable. Skull: No acute fracture.  No aggressive bone lesion identified. Sinuses/Orbits: Unremarkable appearance of the orbits. Mastoid  air cells clear. No middle ear effusion. No significant sinus disease. No old retro bulb are hematoma or significant soft tissue swelling. Other: None CT MAXILLOFACIAL FINDINGS Osseous: No fracture or mandibular dislocation. No destructive process. Orbits: Negative. No traumatic or inflammatory finding. Sinuses: Minimal soft tissue opacification of the right maxillary sinus. No significant left maxillary sinus disease. Frontal sinuses and sphenoid sinuses are relatively clear. No significant ethmoid air cell disease. Soft tissues: There is mild asymmetry of the facial soft tissues on the left in the malar soft tissues with swelling. No focal fluid collection. No radiopaque foreign body Endodontal disease with right mandibular and maxillary dental caries. Periapical lucency of the right mandibular teeth. IMPRESSION: Head CT: No CT evidence of acute intracranial abnormality. Maxillofacial CT: No evidence of left  globe injury.  No radiopaque foreign body. Soft tissue swelling in the left infraorbital tissues/malar tissues with asymmetry of the facial contour. Endodontal disease with dental caries and periapical lucencies, as above. Trace paranasal sinus disease. Electronically Signed   By: Gilmer Mor D.O.   On: 10/01/2016 12:02   Ct Maxillofacial Wo Contrast  Result Date: 10/01/2016 CLINICAL DATA:  41 year old male with eye trauma EXAM: CT HEAD WITHOUT CONTRAST CT MAXILLOFACIAL WITHOUT CONTRAST TECHNIQUE: Multidetector CT imaging of the head and maxillofacial structures were performed using the standard protocol without intravenous contrast. Multiplanar CT image reconstructions of the maxillofacial structures were also generated. COMPARISON:  None. FINDINGS: CT HEAD FINDINGS Brain: No acute intracranial hemorrhage. No midline shift or mass effect. Gray-white differentiation maintained. Unremarkable appearance of the ventricular system. Vascular: Unremarkable. Skull: No acute fracture.  No aggressive bone lesion identified. Sinuses/Orbits: Unremarkable appearance of the orbits. Mastoid air cells clear. No middle ear effusion. No significant sinus disease. No old retro bulb are hematoma or significant soft tissue swelling. Other: None CT MAXILLOFACIAL FINDINGS Osseous: No fracture or mandibular dislocation. No destructive process. Orbits: Negative. No traumatic or inflammatory finding. Sinuses: Minimal soft tissue opacification of the right maxillary sinus. No significant left maxillary sinus disease. Frontal sinuses and sphenoid sinuses are relatively clear. No significant ethmoid air cell disease. Soft tissues: There is mild asymmetry of the facial soft tissues on the left in the malar soft tissues with swelling. No focal fluid collection. No radiopaque foreign body Endodontal disease with right mandibular and maxillary dental caries. Periapical lucency of the right mandibular teeth. IMPRESSION: Head CT: No CT evidence  of acute intracranial abnormality. Maxillofacial CT: No evidence of left globe injury.  No radiopaque foreign body. Soft tissue swelling in the left infraorbital tissues/malar tissues with asymmetry of the facial contour. Endodontal disease with dental caries and periapical lucencies, as above. Trace paranasal sinus disease. Electronically Signed   By: Gilmer Mor D.O.   On: 10/01/2016 12:02     Visual Acuity Review  Right Eye Distance: 20/15 Left Eye Distance: 20/25 Bilateral Distance: 20/15 (corrected with glasses)   MDM   1. Contusion of globe of left eye, initial encounter   2. Facial injury, initial encounter   3. Head injury due to trauma    Pt c/o Left side facial pain, bruising, photophobia and headaches since being hit in face with a fast pitch softball.  CT head and maxillofacial: no acute findings including no intracranial abnormality or Left globe injury.  Reassured pt of normal head scan. Advised pt he has a mild concussion based on symptoms. Discussed home care and conservative treatment. F/u with PCP in 1 week if not improving, sooner if worsening.  Junius Finnerrin O'Malley, PA-C 10/01/16 1659

## 2016-10-01 NOTE — ED Triage Notes (Signed)
Patient reports being hit in the left eye 2 days ago while playing softball with his daughter. Since, c/o HA, sharp eye pain and light sensitivity. Taken IBF otc

## 2016-10-08 ENCOUNTER — Other Ambulatory Visit: Payer: Self-pay | Admitting: Physician Assistant

## 2016-12-07 ENCOUNTER — Other Ambulatory Visit: Payer: Self-pay | Admitting: Physician Assistant

## 2016-12-24 ENCOUNTER — Emergency Department (INDEPENDENT_AMBULATORY_CARE_PROVIDER_SITE_OTHER)
Admission: EM | Admit: 2016-12-24 | Discharge: 2016-12-24 | Disposition: A | Discharge status: Home / Self Care | Payer: BLUE CROSS/BLUE SHIELD | Source: Home / Self Care | Attending: Family Medicine | Admitting: Family Medicine

## 2016-12-24 ENCOUNTER — Encounter: Payer: Self-pay | Admitting: *Deleted

## 2016-12-24 DIAGNOSIS — I951 Orthostatic hypotension: Secondary | ICD-10-CM

## 2016-12-24 DIAGNOSIS — R739 Hyperglycemia, unspecified: Secondary | ICD-10-CM

## 2016-12-24 DIAGNOSIS — Z9114 Patient's other noncompliance with medication regimen: Secondary | ICD-10-CM | POA: Diagnosis not present

## 2016-12-24 DIAGNOSIS — R42 Dizziness and giddiness: Secondary | ICD-10-CM

## 2016-12-24 LAB — POCT URINALYSIS DIP (MANUAL ENTRY)
Bilirubin, UA: NEGATIVE
Glucose, UA: 500 mg/dL — AB
Ketones, POC UA: NEGATIVE mg/dL
Leukocytes, UA: NEGATIVE
Nitrite, UA: NEGATIVE
Protein Ur, POC: 30 mg/dL — AB
Spec Grav, UA: 1.01 (ref 1.010–1.025)
Urobilinogen, UA: 0.2 E.U./dL
pH, UA: 5.5 (ref 5.0–8.0)

## 2016-12-24 LAB — POCT CBC W AUTO DIFF (K'VILLE URGENT CARE)

## 2016-12-24 LAB — POCT FASTING CBG KUC MANUAL ENTRY
POCT Glucose (KUC): 460 mg/dL — AB (ref 70–99)
POCT Glucose (KUC): 366 mg/dL — AB (ref 70–99)

## 2016-12-24 MED ORDER — LEVOTHYROXINE SODIUM 100 MCG PO TABS: 100.0000 ug | ORAL_TABLET | Freq: Every day | ORAL | 0 refills | Status: DC

## 2016-12-24 MED ORDER — SODIUM CHLORIDE 0.9 % IV BOLUS (SEPSIS)
1000.0000 mL | Freq: Once | INTRAVENOUS | Status: AC
  Administered 2016-12-24: 1000 mL via INTRAVENOUS

## 2016-12-24 NOTE — Discharge Instructions (Signed)
° °  It is very important that you follow up with your primary care provider/family doctor routinely for medication refills and lab checks.  If you allow your thyroid and blood sugar to go untested and untreated you can have complications later on including kidney failure, heart problems, vision problems, and even loss of your limbs due to chronically (prolonged) elevated blood sugar if your diabetes and thyroid disease is not well managed.

## 2016-12-24 NOTE — ED Provider Notes (Signed)
CSN: [id number]     Arrival date & time 12/24/16  1331 History   First MD Initiated Contact with Patient 12/24/16 1357     Chief Complaint  Patient presents with  . Dizziness  . Vision Changes   (Consider location/radiation/quality/duration/timing/severity/associated sxs/prior Treatment) HPI  Austin Weeks is a 41 y.o. male presenting to UC with c/o 2 days of worsening dizziness that is worse with certain positives and mild intermittent tunnel vision today at times, especially when standing up too quickly.  Minimal headache at times but none right now. Pt also reports mild fatigue and grogginess for a few weeks. His wife, who is accompanying him, notes he has had increased moodiness. Denies chest pain or palpitations. Denies recent illness of cough or congestion. No n/v/d. He does have hx of Type 2 Diabetes and hypothyroidism.  Pt notes he is still on his DM medication but took his last synthroid about 2-3 weeks ago.  He has not f/u with his PCP for several months but does have a f/u with her on Monday, 6/4.  Denies prior hx of vertigo.    Past Medical History:  Diagnosis Date  . Depressed   . Diabetes mellitus without complication (HCC)   . Heart murmur    as a child  . Hypertension   . Hypothyroidism    Past Surgical History:  Procedure Laterality Date  . CHOLECYSTECTOMY     Family History  Problem Relation Age of Onset  . Depression Mother   . Diabetes Mother   . Hypertension Mother   . Hyperlipidemia Mother   . Sleep apnea Maternal Uncle   . Alcohol abuse Maternal Uncle   . Diabetes Maternal Uncle   . Coronary artery disease Maternal Grandfather   . Heart attack Maternal Grandfather   . Alcohol abuse Paternal Grandfather   . Stroke Paternal Grandfather   . Diabetes Maternal Aunt   . Alcohol abuse Paternal Uncle   . Cancer Maternal Grandmother   . Diabetes Maternal Aunt    Social History  Substance Use Topics  . Smoking status: Former Games developermoker  . Smokeless tobacco:  Never Used     Comment: quit Jan 2010   . Alcohol use Yes     Comment: 3 etoh/week    Review of Systems  Constitutional: Positive for fatigue. Negative for chills and fever.  HENT: Negative for congestion, ear pain, sore throat, trouble swallowing and voice change.   Eyes: Negative for photophobia, pain and visual disturbance.  Respiratory: Negative for cough and shortness of breath.   Cardiovascular: Negative for chest pain and palpitations.  Gastrointestinal: Negative for abdominal pain, diarrhea, nausea and vomiting.  Musculoskeletal: Negative for arthralgias, back pain and myalgias.  Skin: Negative for rash.  Neurological: Positive for dizziness, light-headedness and headaches (minimal). Negative for syncope and weakness.    Allergies  Patient has no known allergies.  Home Medications   Prior to Admission medications   Medication Sig Start Date End Date Taking? Authorizing Provider  AMBULATORY NON FORMULARY MEDICATION One touch ultra 2  Testing 2 twice a day. For Diabetes Mellitus type II, uncontrolled. 04/28/15   Breeback, Jade L, PA-C  atorvastatin (LIPITOR) 40 MG tablet Take 1 tablet (40 mg total) by mouth daily at 6 PM. MUST make appointment for future refills. 09/09/16   Jomarie LongsBreeback, Jade L, PA-C  cyclobenzaprine (FLEXERIL) 10 MG tablet Take one tab PO HS for muscle spasm 05/21/16   Lattie HawBeese, Stephen A, MD  Dapagliflozin-Metformin HCl ER (XIGDUO XR) 04-999  MG TB24 Take 1 tablet by mouth daily. 05/04/16   Monica Becton, MD  Dulaglutide (TRULICITY) 1.5 MG/0.5ML SOPN Inject 1.5 mg into the skin once a week. 05/04/16   Monica Becton, MD  FLUoxetine (PROZAC) 10 MG capsule Take 1 capsule (10 mg total) by mouth daily. 12/11/15   Breeback, Jade L, PA-C  glipiZIDE (GLUCOTROL) 5 MG tablet Take 1 tablet (5 mg total) by mouth 2 (two) times daily before a meal. Needs appointment for future refills. 09/08/16   Breeback, Jade L, PA-C  glucose blood test strip 1 each by Other  route as directed. Use as instructed     [provider]  HYDROcodone-acetaminophen (NORCO/VICODIN) 5-325 MG tablet Take one by mouth at bedtime as needed for pain 05/21/16   Lattie Haw, MD  levothyroxine (SYNTHROID, LEVOTHROID) 100 MCG tablet Take 1 tablet (100 mcg total) by mouth daily before breakfast. MUST make appointment for future refills. 12/24/16   Junius Finner, PA-C  lisinopril (PRINIVIL,ZESTRIL) 5 MG tablet Take 1 tablet (5 mg total) by mouth daily. NEED FOLLOW UP APPOINTMENT FOR MORE REFILLS 08/24/16   Jomarie Longs, PA-C  Monolet Lancets MISC by Does not apply route as directed.      [provider]  omeprazole (PRILOSEC) 40 MG capsule Take 1 capsule (40 mg total) by mouth daily. NEED FOLLOW UP VISIT WITH PCP FOR MORE REFILLS 07/20/16   Jomarie Longs, PA-C   Meds Ordered and Administered this Visit   Medications  sodium chloride 0.9 % bolus 1,000 mL (0 mLs Intravenous Stopped 12/24/16 1550)    BP 139/79 (BP Location: Left Arm)   Pulse (!) 106   Temp 98.5 F (36.9 C) (Oral)   Wt 258 lb (117 kg)   SpO2 96%   BMI 34.04 kg/m  Orthostatic VS for the past 24 hrs:  BP- Lying Pulse- Lying BP- Sitting Pulse- Sitting BP- Standing at 0 minutes Pulse- Standing at 0 minutes  12/24/16 1552 114/78 86 127/86 94 104/70 100  12/24/16 1433 125/86 88 136/89 100 109/79 109    Physical Exam  Constitutional: He is oriented to person, place, and time. He appears well-developed and well-nourished. No distress.  HENT:  Head: Normocephalic and atraumatic.  Right Ear: Tympanic membrane normal.  Left Ear: Tympanic membrane normal.  Nose: Nose normal.  Mouth/Throat: Uvula is midline, oropharynx is clear and moist and mucous membranes are normal.  Eyes: Conjunctivae, EOM and lids are normal. Pupils are equal, round, and reactive to light. Right eye exhibits no nystagmus. Left eye exhibits no nystagmus.  Neck: Normal range of motion. Neck supple.  Cardiovascular: Normal  rate and regular rhythm.   Pulmonary/Chest: Effort normal and breath sounds normal. No stridor. No respiratory distress. He has no wheezes. He has no rales.  Musculoskeletal: Normal range of motion.  Lymphadenopathy:    He has no cervical adenopathy.  Neurological: He is alert and oriented to person, place, and time.  CN II-XII intact. Speech is clear. Normal gait. Alert to person, place and time. Able to follow 2 step commands. Normal coordination.   Skin: Skin is warm and dry. He is not diaphoretic.  Psychiatric: He has a normal mood and affect. His behavior is normal.  Nursing note and vitals reviewed.   Urgent Care Course     Procedures (including critical care time)  Labs Review Labs Reviewed  POCT URINALYSIS DIP (MANUAL ENTRY) - Abnormal; Notable for the following:       Result Value  Glucose, UA =500 (*)    Blood, UA trace-lysed (*)    Protein Ur, POC =30 (*)    All other components within normal limits  POCT FASTING CBG [facility] MANUAL ENTRY - Abnormal; Notable for the following:    POCT Glucose ([facility]) 460 (*)    All other components within normal limits  POCT FASTING CBG [facility] MANUAL ENTRY - Abnormal; Notable for the following:    POCT Glucose ([facility]) 366 (*)    All other components within normal limits  COMPLETE METABOLIC PANEL WITH GFR  TSH  HEMOGLOBIN A1C  POCT CBC W AUTO DIFF ([facility])    Imaging Review No results found.   MDM   1. Dizziness   2. Orthostatic hypotension   3. Hyperglycemia   4. Noncompliance with medications     CBG- 460 Mild orthostatic hypotension.  Labs pending: CMP, Hgb A1c, TSH  1L IV fluids given in UC. Orthostatic vital signs: only slight improvement. CBG- 366  Rx: levothyroxine refilled for 7 days, stressed importance of keeping appointment on Monday, 12/27/16 with PCP to go over todays labs and adjust medication (if needed) and refill  Discussed symptoms that warrant emergent care in the ED.     Junius Finner,  PA-C 12/24/16 [phone]

## 2016-12-24 NOTE — ED Triage Notes (Signed)
Patient c/o dizziness and tunnel vision x today. Denies HA. Sensitive to some sounds. CBG after eating @ 11am was 335. He reports he has been out of synthroid for 2-3 weeks. Apt with PCP next week.

## 2016-12-25 ENCOUNTER — Telehealth: Payer: Self-pay | Admitting: Emergency Medicine

## 2016-12-25 LAB — COMPLETE METABOLIC PANEL WITH GFR
ALT: 54 U/L — ABNORMAL HIGH (ref 9–46)
AST: 48 U/L — ABNORMAL HIGH (ref 10–40)
Albumin: 4.9 g/dL (ref 3.6–5.1)
Alkaline Phosphatase: 150 U/L — ABNORMAL HIGH (ref 40–115)
BUN: 17 mg/dL (ref 7–25)
CO2: 25 mmol/L (ref 20–31)
Calcium: 10.5 mg/dL — ABNORMAL HIGH (ref 8.6–10.3)
Chloride: 94 mmol/L — ABNORMAL LOW (ref 98–110)
Creat: 1.35 mg/dL (ref 0.60–1.35)
GFR, Est African American: 76 mL/min (ref >=60)
GFR, Est Non African American: 66 mL/min (ref >=60)
Glucose, Bld: 442 mg/dL — ABNORMAL HIGH (ref 65–99)
Potassium: 4.5 mmol/L (ref 3.5–5.3)
Sodium: 134 mmol/L — ABNORMAL LOW (ref 135–146)
Total Bilirubin: 0.7 mg/dL (ref 0.2–1.2)
Total Protein: 8 g/dL (ref 6.1–8.1)

## 2016-12-25 LAB — TSH: TSH: >150 mIU/L — ABNORMAL HIGH (ref 0.40–4.50)

## 2016-12-25 LAB — HEMOGLOBIN A1C
Hgb A1c MFr Bld: 13.6 % — ABNORMAL HIGH (ref <5.7)
Mean Plasma Glucose: 344 mg/dL

## 2016-12-27 ENCOUNTER — Ambulatory Visit (INDEPENDENT_AMBULATORY_CARE_PROVIDER_SITE_OTHER): Payer: BLUE CROSS/BLUE SHIELD | Admitting: Physician Assistant

## 2016-12-27 ENCOUNTER — Encounter: Payer: Self-pay | Admitting: Physician Assistant

## 2016-12-27 VITALS — BP 122/83 | HR 97 | Ht 73.0 in | Wt 255.0 lb

## 2016-12-27 DIAGNOSIS — E118 Type 2 diabetes mellitus with unspecified complications: Secondary | ICD-10-CM | POA: Diagnosis not present

## 2016-12-27 DIAGNOSIS — Z794 Long term (current) use of insulin: Secondary | ICD-10-CM

## 2016-12-27 DIAGNOSIS — E039 Hypothyroidism, unspecified: Secondary | ICD-10-CM

## 2016-12-27 DIAGNOSIS — E1169 Type 2 diabetes mellitus with other specified complication: Secondary | ICD-10-CM | POA: Diagnosis not present

## 2016-12-27 DIAGNOSIS — K219 Gastro-esophageal reflux disease without esophagitis: Secondary | ICD-10-CM | POA: Diagnosis not present

## 2016-12-27 DIAGNOSIS — E785 Hyperlipidemia, unspecified: Secondary | ICD-10-CM

## 2016-12-27 DIAGNOSIS — F329 Major depressive disorder, single episode, unspecified: Secondary | ICD-10-CM

## 2016-12-27 DIAGNOSIS — F32A Depression, unspecified: Secondary | ICD-10-CM

## 2016-12-27 MED ORDER — FLUOXETINE HCL 10 MG PO CAPS: 10.0000 mg | ORAL_CAPSULE | Freq: Every day | ORAL | 0 refills | Status: DC

## 2016-12-27 MED ORDER — DAPAGLIFLOZIN PRO-METFORMIN ER 10-1000 MG PO TB24: 1.0000 | ORAL_TABLET | Freq: Every day | ORAL | 0 refills | Status: DC

## 2016-12-27 MED ORDER — GLIPIZIDE 5 MG PO TABS: 5.0000 mg | ORAL_TABLET | Freq: Two times a day (BID) | ORAL | 0 refills | Status: DC

## 2016-12-27 MED ORDER — LEVOTHYROXINE SODIUM 100 MCG PO TABS: 100.0000 ug | ORAL_TABLET | Freq: Every day | ORAL | 1 refills | Status: DC

## 2016-12-27 MED ORDER — LISINOPRIL 5 MG PO TABS: 5.0000 mg | ORAL_TABLET | Freq: Every day | ORAL | 0 refills | Status: DC

## 2016-12-27 MED ORDER — INSULIN DEGLUDEC 200 UNIT/ML ~~LOC~~ SOPN: 25.0000 [IU] | PEN_INJECTOR | Freq: Every day | SUBCUTANEOUS | 2 refills | Status: DC

## 2016-12-27 MED ORDER — OMEPRAZOLE 40 MG PO CPDR: 40.0000 mg | DELAYED_RELEASE_CAPSULE | Freq: Every day | ORAL | 3 refills | Status: DC

## 2016-12-27 MED ORDER — ATORVASTATIN CALCIUM 40 MG PO TABS: 40.0000 mg | ORAL_TABLET | Freq: Every day | ORAL | 0 refills | Status: DC

## 2016-12-27 NOTE — Progress Notes (Signed)
Subjective:    Patient ID: Austin Weeks, male    DOB: Dec 10, 1975, 41 y.o.   MRN: [id number]  HPI  Pt is a 41 yo male who presents to the clinic "to get back on his medications and feel better". Pt has several chronic diseases that he has not been compliant with taking medications. He was recently seen in UC after being out of levothyroxine for 3 weeks. He was given a 7 day supply and told to follow up in primary care.   DM- not taking trulicity due to cost. On xigduo. Fasting sugars 150 to 250. He is tired all the time. No hypoglycemic events. No open sores or wounds. Continues to have some vision changes.   Hypothyroidism- in UC TSH was greater than 150. He was started back on medication.   Pt mood has been more irritbale and depressed. He would like to go on prozac again.  .. Active Ambulatory Problems    Diagnosis Date Noted  . Hypothyroidism 01/06/2010  . DM 01/06/2010  . Hyperlipidemia associated with type 2 diabetes mellitus (HCC) 01/06/2010  . OBESITY, UNSPECIFIED 05/15/2009  . DEPRESSIVE DISORDER NOT ELSEWHERE CLASSIFIED 05/15/2009  . ESSENTIAL HYPERTENSION, BENIGN 01/27/2010  . FATIGUE 01/05/2010  . WEIGHT GAIN 05/15/2009  . SHORTNESS OF BREATH 01/05/2010  . CHEST PAIN, LEFT 01/05/2010  . TOBACCO USE, QUIT 05/15/2009  . Generalized anxiety disorder 04/28/2015  . Type 2 diabetes mellitus with complication (HCC) 04/28/2015  . Other specified hypothyroidism 04/28/2015  . Depression 05/01/2015  . Tooth pain 10/24/2015  . Poor dentition 05/04/2016   Resolved Ambulatory Problems    Diagnosis Date Noted  . No Resolved Ambulatory Problems   Past Medical History:  Diagnosis Date  . Depressed   . Diabetes mellitus without complication (HCC)   . Heart murmur   . Hypertension   . Hypothyroidism       Review of Systems  All other systems reviewed and are negative.      Objective:   Physical Exam  Constitutional: He is oriented to person, place, and time. He  appears well-developed and well-nourished.  HENT:  Head: Normocephalic and atraumatic.  Cardiovascular: Normal rate, regular rhythm and normal heart sounds.   Pulmonary/Chest: Effort normal and breath sounds normal.  Neurological: He is alert and oriented to person, place, and time.  Psychiatric: He has a normal mood and affect. His behavior is normal.          Assessment & Plan:  Marland Kitchen.Marland Kitchen.Onalee HuaDavid was seen today for hypertension, hypothyroidism, hyperlipidemia and diabetes.  Diagnoses and all orders for this visit:  Hypothyroidism, unspecified type -     levothyroxine (SYNTHROID, LEVOTHROID) 100 MCG tablet; Take 1 tablet (100 mcg total) by mouth daily before breakfast. -     TSH  Type 2 diabetes mellitus with complication, with long-term current use of insulin (HCC) -     Dapagliflozin-Metformin HCl ER (XIGDUO XR) 04-999 MG TB24; Take 1 tablet by mouth daily. -     glipiZIDE (GLUCOTROL) 5 MG tablet; Take 1 tablet (5 mg total) by mouth 2 (two) times daily before a meal. -     lisinopril (PRINIVIL,ZESTRIL) 5 MG tablet; Take 1 tablet (5 mg total) by mouth daily. -     Insulin Degludec (TRESIBA FLEXTOUCH) 200 UNIT/ML SOPN; Inject 26 Units into the skin at bedtime.  Depression, unspecified depression type -     FLUoxetine (PROZAC) 10 MG capsule; Take 1 capsule (10 mg total) by mouth daily.  Hyperlipidemia associated  with type 2 diabetes mellitus (HCC) -     atorvastatin (LIPITOR) 40 MG tablet; Take 1 tablet (40 mg total) by mouth daily at 6 PM.  Gastroesophageal reflux disease, esophagitis presence not specified -     omeprazole (PRILOSEC) 40 MG capsule; Take 1 capsule (40 mg total) by mouth daily.  .. Lab Results  Component Value Date   HGBA1C 13.6 (H) 12/24/2016    Restarted all medications.  Added tresbia at 25 units at bedtime. Discussed taper up as needed to get fasting glucose under 120.  On ACE/STAIN Discussed diabetic diet and exercise. Discussed eye exam once sugars  regulate.   Follow up in 3 months.   TSH very elevated lab slip given to get checked in 6 weeks and medication adjustment made then. He needs to be back on medication regularly before we recheck.   prozac added back in for mood.   Spent 30 minutes and greater than 50 percent of visit spent counseling patient on committment to medication compliance and treatment plan.

## 2017-01-27 ENCOUNTER — Other Ambulatory Visit: Payer: Self-pay | Admitting: *Deleted

## 2017-01-27 DIAGNOSIS — E039 Hypothyroidism, unspecified: Secondary | ICD-10-CM

## 2017-01-27 MED ORDER — LEVOTHYROXINE SODIUM 100 MCG PO TABS: 100.0000 ug | ORAL_TABLET | Freq: Every day | ORAL | 0 refills | Status: DC

## 2017-02-11 ENCOUNTER — Other Ambulatory Visit: Payer: Self-pay

## 2017-02-11 DIAGNOSIS — E039 Hypothyroidism, unspecified: Secondary | ICD-10-CM

## 2017-02-11 MED ORDER — LEVOTHYROXINE SODIUM 100 MCG PO TABS: 100.0000 ug | ORAL_TABLET | Freq: Every day | ORAL | 0 refills | Status: DC

## 2017-03-29 ENCOUNTER — Other Ambulatory Visit: Payer: Self-pay | Admitting: Physician Assistant

## 2017-03-29 DIAGNOSIS — Z794 Long term (current) use of insulin: Secondary | ICD-10-CM

## 2017-03-29 DIAGNOSIS — F32A Depression, unspecified: Secondary | ICD-10-CM

## 2017-03-29 DIAGNOSIS — F329 Major depressive disorder, single episode, unspecified: Secondary | ICD-10-CM

## 2017-03-29 DIAGNOSIS — E118 Type 2 diabetes mellitus with unspecified complications: Secondary | ICD-10-CM

## 2017-04-03 ENCOUNTER — Other Ambulatory Visit: Payer: Self-pay | Admitting: Physician Assistant

## 2017-04-03 DIAGNOSIS — Z794 Long term (current) use of insulin: Secondary | ICD-10-CM

## 2017-04-03 DIAGNOSIS — E118 Type 2 diabetes mellitus with unspecified complications: Secondary | ICD-10-CM

## 2017-04-09 DIAGNOSIS — I10 Essential (primary) hypertension: Secondary | ICD-10-CM | POA: Diagnosis not present

## 2017-04-09 DIAGNOSIS — K055 Other periodontal diseases: Secondary | ICD-10-CM | POA: Diagnosis not present

## 2017-04-19 ENCOUNTER — Other Ambulatory Visit: Payer: Self-pay | Admitting: Physician Assistant

## 2017-04-19 DIAGNOSIS — E118 Type 2 diabetes mellitus with unspecified complications: Secondary | ICD-10-CM

## 2017-04-19 DIAGNOSIS — Z794 Long term (current) use of insulin: Secondary | ICD-10-CM

## 2017-04-23 ENCOUNTER — Other Ambulatory Visit: Payer: Self-pay | Admitting: Physician Assistant

## 2017-04-23 DIAGNOSIS — F329 Major depressive disorder, single episode, unspecified: Secondary | ICD-10-CM

## 2017-04-23 DIAGNOSIS — F32A Depression, unspecified: Secondary | ICD-10-CM

## 2017-05-09 ENCOUNTER — Other Ambulatory Visit: Payer: Self-pay | Admitting: Physician Assistant

## 2017-05-09 DIAGNOSIS — E039 Hypothyroidism, unspecified: Secondary | ICD-10-CM

## 2017-06-03 ENCOUNTER — Ambulatory Visit: Payer: Self-pay | Admitting: Physician Assistant

## 2017-06-03 ENCOUNTER — Encounter: Payer: Self-pay | Admitting: Physician Assistant

## 2017-06-03 ENCOUNTER — Ambulatory Visit (INDEPENDENT_AMBULATORY_CARE_PROVIDER_SITE_OTHER): Payer: BLUE CROSS/BLUE SHIELD | Admitting: Physician Assistant

## 2017-06-03 ENCOUNTER — Other Ambulatory Visit: Payer: Self-pay | Admitting: Physician Assistant

## 2017-06-03 VITALS — BP 137/77 | HR 70 | Ht 73.0 in | Wt 271.0 lb

## 2017-06-03 DIAGNOSIS — Z794 Long term (current) use of insulin: Secondary | ICD-10-CM | POA: Diagnosis not present

## 2017-06-03 DIAGNOSIS — Z23 Encounter for immunization: Secondary | ICD-10-CM

## 2017-06-03 DIAGNOSIS — E039 Hypothyroidism, unspecified: Secondary | ICD-10-CM

## 2017-06-03 DIAGNOSIS — E785 Hyperlipidemia, unspecified: Secondary | ICD-10-CM

## 2017-06-03 DIAGNOSIS — E1169 Type 2 diabetes mellitus with other specified complication: Secondary | ICD-10-CM

## 2017-06-03 DIAGNOSIS — E118 Type 2 diabetes mellitus with unspecified complications: Secondary | ICD-10-CM | POA: Diagnosis not present

## 2017-06-03 DIAGNOSIS — Z13220 Encounter for screening for lipoid disorders: Secondary | ICD-10-CM | POA: Diagnosis not present

## 2017-06-03 DIAGNOSIS — F411 Generalized anxiety disorder: Secondary | ICD-10-CM

## 2017-06-03 DIAGNOSIS — F339 Major depressive disorder, recurrent, unspecified: Secondary | ICD-10-CM

## 2017-06-03 DIAGNOSIS — N521 Erectile dysfunction due to diseases classified elsewhere: Secondary | ICD-10-CM

## 2017-06-03 LAB — POCT GLYCOSYLATED HEMOGLOBIN (HGB A1C): Hemoglobin A1C: 9.2

## 2017-06-03 MED ORDER — SILDENAFIL CITRATE 100 MG PO TABS: 100.0000 mg | ORAL_TABLET | Freq: Every day | ORAL | 2 refills | Status: DC | PRN

## 2017-06-03 MED ORDER — ATORVASTATIN CALCIUM 40 MG PO TABS: 40.0000 mg | ORAL_TABLET | Freq: Every day | ORAL | 0 refills | Status: DC

## 2017-06-03 MED ORDER — SEMAGLUTIDE(0.25 OR 0.5MG/DOS) 2 MG/1.5ML ~~LOC~~ SOPN: 0.2500 mg | PEN_INJECTOR | Freq: Every day | SUBCUTANEOUS | 2 refills | Status: DC

## 2017-06-03 MED ORDER — VILAZODONE HCL 20 MG PO TABS: 1.0000 | ORAL_TABLET | Freq: Every day | ORAL | 2 refills | Status: DC

## 2017-06-03 NOTE — Patient Instructions (Addendum)
Semaglutide injection solution What is this medicine? SEMAGLUTIDE (Sem a GLOO tide) is used to improve blood sugar control in adults with type 2 diabetes. This medicine may be used with other diabetes medicines. This medicine may be used for other purposes; ask your health care provider or pharmacist if you have questions. COMMON BRAND NAME(S): OZEMPIC What should I tell my health care provider before I take this medicine? They need to know if you have any of these conditions: -endocrine tumors (MEN 2) or if someone in your family had these tumors -eye disease, vision problems -history of pancreatitis -kidney disease -stomach problems -thyroid cancer or if someone in your family had thyroid cancer -an unusual or allergic reaction to semaglutide, other medicines, foods, dyes, or preservatives -pregnant or trying to get pregnant -breast-feeding How should I use this medicine? This medicine is for injection under the skin of your upper leg (thigh), stomach area, or upper arm. It is given once every week (every 7 days). You will be taught how to prepare and give this medicine. Use exactly as directed. Take your medicine at regular intervals. Do not take it more often than directed. If you use this medicine with insulin, you should inject this medicine and the insulin separately. Do not mix them together. Do not give the injections right next to each other. Change (rotate) injection sites with each injection. It is important that you put your used needles and syringes in a special sharps container. Do not put them in a trash can. If you do not have a sharps container, call your pharmacist or healthcare provider to get one. A special MedGuide will be given to you by the pharmacist with each prescription and refill. Be sure to read this information carefully each time. Talk to your pediatrician regarding the use of this medicine in children. Special care may be needed. Overdosage: If you think you  have taken too much of this medicine contact a poison control center or emergency room at once. NOTE: This medicine is only for you. Do not share this medicine with others. What if I miss a dose? If you miss a dose, take it as soon as you can within 5 days after the missed dose. Then take your next dose at your regular weekly time. If it has been longer than 5 days after the missed dose, do not take the missed dose. Take the next dose at your regular time. Do not take double or extra doses. If you have questions about a missed dose, contact your health care provider for advice. What may interact with this medicine? -other medicines for diabetes Many medications may cause changes in blood sugar, these include: -alcohol containing beverages -antiviral medicines for HIV or AIDS -aspirin and aspirin-like drugs -certain medicines for blood pressure, heart disease, irregular heart beat -chromium -diuretics -male hormones, such as estrogens or progestins, birth control pills -fenofibrate -gemfibrozil -isoniazid -lanreotide -male hormones or anabolic steroids -MAOIs like Carbex, Eldepryl, Marplan, Nardil, and Parnate -medicines for weight loss -medicines for allergies, asthma, cold, or cough -medicines for depression, anxiety, or psychotic disturbances -niacin -nicotine -NSAIDs, medicines for pain and inflammation, like ibuprofen or naproxen -octreotide -pasireotide -pentamidine -phenytoin -probenecid -quinolone antibiotics such as ciprofloxacin, levofloxacin, ofloxacin -some herbal dietary supplements -steroid medicines such as prednisone or cortisone -sulfamethoxazole; trimethoprim -thyroid hormones Some medications can hide the warning symptoms of low blood sugar (hypoglycemia). You may need to monitor your blood sugar more closely if you are taking one of these medications. These include: -  beta-blockers, often used for high blood pressure or heart problems (examples include  atenolol, metoprolol, propranolol) -clonidine -guanethidine -reserpine This list may not describe all possible interactions. Give your health care provider a list of all the medicines, herbs, non-prescription drugs, or dietary supplements you use. Also tell them if you smoke, drink alcohol, or use illegal drugs. Some items may interact with your medicine. What should I watch for while using this medicine? Visit your doctor or health care professional for regular checks on your progress. Drink plenty of fluids while taking this medicine. Check with your doctor or health care professional if you get an attack of severe diarrhea, nausea, and vomiting. The loss of too much body fluid can make it dangerous for you to take this medicine. A test called the HbA1C (A1C) will be monitored. This is a simple blood test. It measures your blood sugar control over the last 2 to 3 months. You will receive this test every 3 to 6 months. Learn how to check your blood sugar. Learn the symptoms of low and high blood sugar and how to manage them. Always carry a quick-source of sugar with you in case you have symptoms of low blood sugar. Examples include hard sugar candy or glucose tablets. Make sure others know that you can choke if you eat or drink when you develop serious symptoms of low blood sugar, such as seizures or unconsciousness. They must get medical help at once. Tell your doctor or health care professional if you have high blood sugar. You might need to change the dose of your medicine. If you are sick or exercising more than usual, you might need to change the dose of your medicine. Do not skip meals. Ask your doctor or health care professional if you should avoid alcohol. Many nonprescription cough and cold products contain sugar or alcohol. These can affect blood sugar. Pens should never be shared. Even if the needle is changed, sharing may result in passing of viruses like hepatitis or HIV. Wear a medical  ID bracelet or chain, and carry a card that describes your disease and details of your medicine and dosage times. What side effects may I notice from receiving this medicine? Side effects that you should report to your doctor or health care professional as soon as possible: -allergic reactions like skin rash, itching or hives, swelling of the face, lips, or tongue -breathing problems -changes in vision -diarrhea that continues or is severe -lump or swelling on the neck -severe nausea -signs and symptoms of infection like fever or chills; cough; sore throat; pain or trouble passing urine -signs and symptoms of low blood sugar such as feeling anxious, confusion, dizziness, increased hunger, unusually weak or tired, sweating, shakiness, cold, irritable, headache, blurred vision, fast heartbeat, loss of consciousness -signs and symptoms of kidney injury like trouble passing urine or change in the amount of urine -trouble swallowing -unusual stomach upset or pain -vomiting Side effects that usually do not require medical attention (report to your doctor or health care professional if they continue or are bothersome): -constipation -diarrhea -nausea -pain, redness, or irritation at site where injected -stomach upset This list may not describe all possible side effects. Call your doctor for medical advice about side effects. You may report side effects to FDA at 1-800-FDA-1088. Where should I keep my medicine? Keep out of the reach of children. Store unopened pens in a refrigerator between 2 and 8 degrees C (36 and 46 degrees F). Do not freeze. Protect from  light and heat. After you first use the pen, it can be stored for 56 days at room temperature between 15 and 30 degrees C (59 and 86 degrees F) or in a refrigerator. Throw away your used pen after 56 days or after the expiration date, whichever comes first. Do not store your pen with the needle attached. If the needle is left on, medicine may  leak from the pen. NOTE: This sheet is a summary. It may not cover all possible information. If you have questions about this medicine, talk to your doctor, pharmacist, or health care provider.  2018 Elsevier/Gold Standard (2016-07-29 14:43:35) Erectile Dysfunction Erectile dysfunction (ED) is the inability to get or keep an erection in order to have sexual intercourse. Erectile dysfunction may include:  Inability to get an erection.  Lack of enough hardness of the erection to allow penetration.  Loss of the erection before sex is finished.  What are the causes? This condition may be caused by:  Certain medicines, such as: ? Pain relievers. ? Antihistamines. ? Antidepressants. ? Blood pressure medicines. ? Water pills (diuretics). ? Ulcer medicines. ? Muscle relaxants. ? Drugs.  Excessive drinking.  Psychological causes, such as: ? Anxiety. ? Depression. ? Sadness. ? Exhaustion. ? Performance fear. ? Stress.  Physical causes, such as: ? Artery problems. This may include diabetes, smoking, liver disease, or atherosclerosis. ? High blood pressure. ? Hormonal problems, such as low testosterone. ? Obesity. ? Nerve problems. This may include back or pelvic injuries, diabetes mellitus, multiple sclerosis, or Parkinson disease.  What are the signs or symptoms? Symptoms of this condition include:  Inability to get an erection.  Lack of enough hardness of the erection to allow penetration.  Loss of the erection before sex is finished.  Normal erections at some times, but with frequent unsatisfactory episodes.  Low sexual satisfaction in either partner due to erection problems.  A curved penis occurring with erection. The curve may cause pain or the penis may be too curved to allow for intercourse.  Never having nighttime erections.  How is this diagnosed? This condition is often diagnosed by:  Performing a physical exam to find other diseases or specific problems  with the penis.  Asking you detailed questions about the problem.  Performing blood tests to check for diabetes mellitus or to measure hormone levels.  Performing other tests to check for underlying health conditions.  Performing an ultrasound exam to check for scarring.  Performing a test to check blood flow to the penis.  Doing a sleep study at home to measure nighttime erections.  How is this treated? This condition may be treated by:  Medicine taken by mouth to help you achieve an erection (oral medicine).  Hormone replacement therapy to replace low testosterone levels.  Medicine that is injected into the penis. Your health care provider may instruct you how to give yourself these injections at home.  Vacuum pump. This is a pump with a ring on it. The pump and ring are placed on the penis and used to create pressure that helps the penis become erect.  Penile implant surgery. In this procedure, you may receive: ? An inflatable implant. This consists of cylinders, a pump, and a reservoir. The cylinders can be inflated with a fluid that helps to create an erection, and they can be deflated after intercourse. ? A semi-rigid implant. This consists of two silicone rubber rods. The rods provide some rigidity. They are also flexible, so the penis can both curve  downward in its normal position and become straight for sexual intercourse.  Blood vessel surgery, to improve blood flow to the penis. During this procedure, a blood vessel from a different part of the body is placed into the penis to allow blood to flow around (bypass) damaged or blocked blood vessels.  Lifestyle changes, such as exercising more, losing weight, and quitting smoking.  Follow these instructions at home: Medicines  Take over-the-counter and prescription medicines only as told by your health care provider. Do not increase the dosage without first discussing it with your health care provider.  If you are using  self-injections, perform injections as directed by your health care provider. Make sure to avoid any veins that are on the surface of the penis. After giving an injection, apply pressure to the injection site for 5 minutes. General instructions  Exercise regularly, as directed by your health care provider. Work with your health care provider to lose weight, if needed.  Do not use any products that contain nicotine or tobacco, such as cigarettes and e-cigarettes. If you need help quitting, ask your health care provider.  Before using a vacuum pump, read the instructions that come with the pump and discuss any questions with your health care provider.  Keep all follow-up visits as told by your health care provider. This is important. Contact a health care provider if:  You feel nauseous.  You vomit. Get help right away if:  You are taking oral or injectable medicines and you have an erection that lasts longer than 4 hours. If your health care provider is unavailable, go to the nearest emergency room for evaluation. An erection that lasts much longer than 4 hours can result in permanent damage to your penis.  You have severe pain in your groin or abdomen.  You develop redness or severe swelling of your penis.  You have redness spreading up into your groin or lower abdomen.  You are unable to urinate.  You experience chest pain or a rapid heart beat (palpitations) after taking oral medicines. Summary  Erectile dysfunction (ED) is the inability to get or keep an erection during sexual intercourse. This problem can usually be treated successfully.  This condition is diagnosed based on a physical exam, your symptoms, and tests to determine the cause. Treatment varies depending on the cause, and may include medicines, hormone therapy, surgery, or vacuum pump.  You may need follow-up visits to make sure that you are using your medicines or devices correctly.  Get help right away if you are  taking or injecting medicines and you have an erection that lasts longer than 4 hours. This information is not intended to replace advice given to you by your health care provider. Make sure you discuss any questions you have with your health care provider. Document Released: 07/09/2000 Document Revised: 07/28/2016 Document Reviewed: 07/28/2016 Elsevier Interactive Patient Education  2017 [employer]

## 2017-06-03 NOTE — Progress Notes (Signed)
Subjective:    Patient ID: Austin Weeks, male    DOB: [birth year], 41 y.o.   MRN: [id number]  HPI  Pt is a 41 yo male who presents to the clinic for 3 month DM follow up.   DM- he is taking tresiba 26 units at bedtime with xigduo. Sugars are 130-150 in am. He is working out with cardio. Denies any pancreatitis. No open sores or wounds. No hypoglycemia. Last a1c was 13.6.   Hypothyroidism- needs refills and labs.   Depression- on prozac and helped for a while but concerned that could be having sexual side effects. Pt is having problems with ED.    .. Active Ambulatory Problems    Diagnosis Date Noted  . Hypothyroidism 01/06/2010  . DM 01/06/2010  . Hyperlipidemia associated with type 2 diabetes mellitus (HCC) 01/06/2010  . OBESITY, UNSPECIFIED 05/15/2009  . DEPRESSIVE DISORDER NOT ELSEWHERE CLASSIFIED 05/15/2009  . ESSENTIAL HYPERTENSION, BENIGN 01/27/2010  . FATIGUE 01/05/2010  . WEIGHT GAIN 05/15/2009  . SHORTNESS OF BREATH 01/05/2010  . CHEST PAIN, LEFT 01/05/2010  . TOBACCO USE, QUIT 05/15/2009  . Generalized anxiety disorder 04/28/2015  . Type 2 diabetes mellitus with complication (HCC) 04/28/2015  . Other specified hypothyroidism 04/28/2015  . Depression 05/01/2015  . Tooth pain 10/24/2015  . Poor dentition 05/04/2016   Resolved Ambulatory Problems    Diagnosis Date Noted  . No Resolved Ambulatory Problems   Past Medical History:  Diagnosis Date  . Depressed   . Diabetes mellitus without complication (HCC)   . Heart murmur   . Hypertension   . Hypothyroidism         Review of Systems  All other systems reviewed and are negative.      Objective:   Physical Exam  Constitutional: He is oriented to person, place, and time. He appears well-developed and well-nourished.  HENT:  Head: Normocephalic and atraumatic.  Cardiovascular: Normal rate, regular rhythm and normal heart sounds.  Pulmonary/Chest: Effort normal and breath sounds normal. He has no  wheezes.  Neurological: He is alert and oriented to person, place, and time.  Psychiatric: He has a normal mood and affect. His behavior is normal.          Assessment & Plan:  Marland Kitchen.Marland Kitchen.Austin HuaDavid was seen today for hypertension and diabetes.  Diagnoses and all orders for this visit:  Type 2 diabetes mellitus with complication, with long-term current use of insulin (HCC) -     POCT HgB A1C -     Lipid Panel w/reflex Direct LDL -     COMPLETE METABOLIC PANEL WITH GFR -     Semaglutide (OZEMPIC) 0.25 or 0.5 MG/DOSE SOPN; Inject 0.25 mg daily into the skin. For 30 days then increase to .5mg  daily.  Influenza vaccine needed -     Flu Vaccine QUAD 6+ mos PF IM (Fluarix Quad PF)  Screening for lipid disorders -     Lipid Panel w/reflex Direct LDL  Hyperlipidemia associated with type 2 diabetes mellitus (HCC) -     atorvastatin (LIPITOR) 40 MG tablet; Take 1 tablet (40 mg total) daily at 6 PM by mouth.  Acquired hypothyroidism -     TSH  Erectile dysfunction due to diseases classified elsewhere -     sildenafil (VIAGRA) 100 MG tablet; Take 1 tablet (100 mg total) daily as needed by mouth for erectile dysfunction.  Depression, recurrent (HCC) -     Vilazodone HCl (VIIBRYD) 20 MG TABS; Take 1 tablet (20 mg total) daily  by mouth.  GAD (generalized anxiety disorder) -     Vilazodone HCl (VIIBRYD) 20 MG TABS; Take 1 tablet (20 mg total) daily by mouth.   .. Depression screen PHQ 2/9 06/03/2017  Decreased Interest 2  Down, Depressed, Hopeless 2  PHQ - 2 Score 4  Altered sleeping 1  Tired, decreased energy 1  Change in appetite 0  Feeling bad or failure about yourself  0  Trouble concentrating 0  Moving slowly or fidgety/restless 2  Suicidal thoughts 0  PHQ-9 Score 8  Difficult doing work/chores Very difficult   .Marland Kitchen. GAD 7 : Generalized Anxiety Score 06/03/2017  Nervous, Anxious, on Edge 2  Control/stop worrying 1  Worry too much - different things 2  Trouble relaxing 1  Restless 1   Easily annoyed or irritable 3  Afraid - awful might happen 2  Total GAD 7 Score 12   .Marland Kitchen. Lab Results  Component Value Date   HGBA1C 9.2 06/03/2017    Stop prozac. Start viibryd. Hopefully less side effects. Follow up in 3 months. Discussed side effects.   A1C improved but not to goal.  Added ozempic injection. Discussed titrating up. Discussed side effects. Follow up in 3 months.  On STATIN.  BP controlled.  Flu/pneumonia shot up to date  Discussed ED could be due to DM and/or medications. Given viagra to try as needed. Made medication change. Follow up in 3 months.

## 2017-06-04 LAB — COMPLETE METABOLIC PANEL WITH GFR
AG Ratio: 1.7 (calc) (ref 1.0–2.5)
ALBUMIN MSPROF: 4.9 g/dL (ref 3.6–5.1)
ALT: 46 U/L (ref 9–46)
AST: 34 U/L (ref 10–40)
Alkaline phosphatase (APISO): 105 U/L (ref 40–115)
BUN: 16 mg/dL (ref 7–25)
CO2: 26 mmol/L (ref 20–32)
CREATININE: 1.32 mg/dL (ref 0.60–1.35)
Calcium: 9.8 mg/dL (ref 8.6–10.3)
Chloride: 99 mmol/L (ref 98–110)
GFR, EST NON AFRICAN AMERICAN: 67 mL/min/{1.73_m2} (ref >=60)
GFR, Est African American: 78 mL/min/{1.73_m2} (ref >=60)
GLOBULIN: 2.9 g/dL (ref 1.9–3.7)
Glucose, Bld: 164 mg/dL — ABNORMAL HIGH (ref 65–99)
Potassium: 4.4 mmol/L (ref 3.5–5.3)
SODIUM: 136 mmol/L (ref 135–146)
TOTAL PROTEIN: 7.8 g/dL (ref 6.1–8.1)
Total Bilirubin: 0.7 mg/dL (ref 0.2–1.2)

## 2017-06-04 LAB — LIPID PANEL W/REFLEX DIRECT LDL
CHOLESTEROL: 158 mg/dL (ref <200)
HDL: 23 mg/dL — ABNORMAL LOW (ref >40)
LDL Cholesterol (Calc): 96 mg/dL (calc)
Non-HDL Cholesterol (Calc): 135 mg/dL (calc) — ABNORMAL HIGH (ref <130)
Total CHOL/HDL Ratio: 6.9 (calc) — ABNORMAL HIGH (ref <5.0)
Triglycerides: 294 mg/dL — ABNORMAL HIGH (ref <150)

## 2017-06-04 LAB — TSH: TSH: >150 mIU/L — ABNORMAL HIGH (ref 0.40–4.50)

## 2017-06-06 DIAGNOSIS — F339 Major depressive disorder, recurrent, unspecified: Secondary | ICD-10-CM | POA: Insufficient documentation

## 2017-06-06 DIAGNOSIS — F411 Generalized anxiety disorder: Secondary | ICD-10-CM | POA: Insufficient documentation

## 2017-06-06 DIAGNOSIS — N521 Erectile dysfunction due to diseases classified elsewhere: Secondary | ICD-10-CM | POA: Insufficient documentation

## 2017-06-06 MED ORDER — LISINOPRIL 5 MG PO TABS: 5.0000 mg | ORAL_TABLET | Freq: Every day | ORAL | 1 refills | Status: DC

## 2017-06-06 MED ORDER — GLIPIZIDE 5 MG PO TABS: 5.0000 mg | ORAL_TABLET | Freq: Two times a day (BID) | ORAL | 1 refills | Status: DC

## 2017-06-06 MED ORDER — ATORVASTATIN CALCIUM 40 MG PO TABS: 40.0000 mg | ORAL_TABLET | Freq: Every day | ORAL | 1 refills | Status: DC

## 2017-06-06 MED ORDER — LEVOTHYROXINE SODIUM 100 MCG PO TABS: 100.0000 ug | ORAL_TABLET | Freq: Every day | ORAL | 1 refills | Status: DC

## 2017-06-06 MED ORDER — DAPAGLIFLOZIN PRO-METFORMIN ER 10-1000 MG PO TB24: 1.0000 | ORAL_TABLET | Freq: Every day | ORAL | 1 refills | Status: DC

## 2017-06-10 ENCOUNTER — Telehealth: Payer: Self-pay | Admitting: Physician Assistant

## 2017-06-10 DIAGNOSIS — Z794 Long term (current) use of insulin: Secondary | ICD-10-CM

## 2017-06-10 DIAGNOSIS — E118 Type 2 diabetes mellitus with unspecified complications: Secondary | ICD-10-CM

## 2017-06-10 MED ORDER — SEMAGLUTIDE(0.25 OR 0.5MG/DOS) 2 MG/1.5ML ~~LOC~~ SOPN: 0.2500 mg | PEN_INJECTOR | SUBCUTANEOUS | 2 refills | Status: DC

## 2017-06-10 NOTE — Telephone Encounter (Signed)
Changed to correct once weekly dose.

## 2017-07-25 ENCOUNTER — Other Ambulatory Visit: Payer: Self-pay | Admitting: Physician Assistant

## 2017-07-25 DIAGNOSIS — F329 Major depressive disorder, single episode, unspecified: Secondary | ICD-10-CM

## 2017-07-25 DIAGNOSIS — F32A Depression, unspecified: Secondary | ICD-10-CM

## 2017-07-25 NOTE — Telephone Encounter (Signed)
Rx was recently d/c'd and started on Viibryd. Please advise.

## 2017-07-25 NOTE — Telephone Encounter (Signed)
Pt is taking Viibyrd states this was filled through automated call. Please disregard.

## 2017-07-25 NOTE — Telephone Encounter (Signed)
Call pt: and make sure he was able to pick up viibryd and taking that.

## 2017-09-05 ENCOUNTER — Ambulatory Visit: Payer: Self-pay | Admitting: Physician Assistant

## 2017-09-05 DIAGNOSIS — Z0189 Encounter for other specified special examinations: Secondary | ICD-10-CM

## 2017-09-21 ENCOUNTER — Other Ambulatory Visit: Payer: Self-pay

## 2017-09-21 ENCOUNTER — Encounter: Payer: Self-pay | Admitting: *Deleted

## 2017-09-21 ENCOUNTER — Emergency Department (INDEPENDENT_AMBULATORY_CARE_PROVIDER_SITE_OTHER)
Admission: EM | Admit: 2017-09-21 | Discharge: 2017-09-21 | Disposition: A | Discharge status: Home / Self Care | Payer: BLUE CROSS/BLUE SHIELD | Source: Home / Self Care | Attending: Emergency Medicine | Admitting: Emergency Medicine

## 2017-09-21 DIAGNOSIS — M545 Low back pain, unspecified: Secondary | ICD-10-CM

## 2017-09-21 DIAGNOSIS — S29019A Strain of muscle and tendon of unspecified wall of thorax, initial encounter: Secondary | ICD-10-CM

## 2017-09-21 MED ORDER — IBUPROFEN 800 MG PO TABS: 800.0000 mg | ORAL_TABLET | Freq: Three times a day (TID) | ORAL | 0 refills | Status: DC

## 2017-09-21 MED ORDER — KETOROLAC TROMETHAMINE 60 MG/2ML IM SOLN
60.0000 mg | Freq: Once | INTRAMUSCULAR | Status: AC
  Administered 2017-09-21: 60 mg via INTRAMUSCULAR

## 2017-09-21 MED ORDER — HYDROCODONE-ACETAMINOPHEN 5-325 MG PO TABS: ORAL_TABLET | ORAL | 0 refills | Status: DC

## 2017-09-21 NOTE — Discharge Instructions (Signed)
Apply ice packs to your back. Do stretches after applying heat to your  back. If he developed worsening radicular symptoms please return to clinic.

## 2017-09-21 NOTE — ED Triage Notes (Signed)
Pt c/o mid back pain x 2-3 days. Denies injury. Reports hx of herniated disc in L-spine. Taking IBF with some relief, none today.

## 2017-09-21 NOTE — ED Provider Notes (Signed)
[facility]    CSN: [id number] Arrival date & time: 09/21/17  1314     History   Chief Complaint Chief Complaint  Patient presents with  . Back Pain  Patient presents with a history of chronic problems with his back. This most recent episode began 2-3 days ago but the origin of the pain seemed to be between his shoulder blades. This is unusual for him as he typically has trouble with his low back with radiculopathy into the right leg. The pain he has now was not associated with a recent injury. Since the mid back pain began he has started to have discomfort into his lower back and also into the back of his right leg. He has had no bowel or bladder symptoms. He has taken ibuprofen with minimal relief. He also occasionally has a sharp shooting pain on the left side that feels like a nerve type pain to him.  HPI Austin Weeks is a 42 y.o. male.   HPI  Past Medical History:  Diagnosis Date  . Depressed   . Diabetes mellitus without complication (HCC)   . Heart murmur    as a child  . Hypertension   . Hypothyroidism     Patient Active Problem List   Diagnosis Date Noted  . GAD (generalized anxiety disorder) 06/06/2017  . Depression, recurrent (HCC) 06/06/2017  . Erectile dysfunction due to diseases classified elsewhere 06/06/2017  . Poor dentition 05/04/2016  . Tooth pain 10/24/2015  . Depression 05/01/2015  . Generalized anxiety disorder 04/28/2015  . Type 2 diabetes mellitus with complication (HCC) 04/28/2015  . Other specified hypothyroidism 04/28/2015  . ESSENTIAL HYPERTENSION, BENIGN 01/27/2010  . Hypothyroidism 01/06/2010  . DM 01/06/2010  . Hyperlipidemia associated with type 2 diabetes mellitus (HCC) 01/06/2010  . FATIGUE 01/05/2010  . SHORTNESS OF BREATH 01/05/2010  . CHEST PAIN, LEFT 01/05/2010  . OBESITY, UNSPECIFIED 05/15/2009  . DEPRESSIVE DISORDER NOT ELSEWHERE CLASSIFIED 05/15/2009  . WEIGHT GAIN 05/15/2009  . TOBACCO USE, QUIT 05/15/2009     Past Surgical History:  Procedure Laterality Date  . CHOLECYSTECTOMY         Home Medications    Prior to Admission medications   Medication Sig Start Date End Date Taking? Authorizing Provider  AMBULATORY NON FORMULARY MEDICATION One touch ultra 2  Testing 2 twice a day. For Diabetes Mellitus type II, uncontrolled. 04/28/15   Breeback, Jade L, PA-C  atorvastatin (LIPITOR) 40 MG tablet Take 1 tablet (40 mg total) daily at 6 PM by mouth. 06/06/17   Breeback, Jade L, PA-C  Dapagliflozin-Metformin HCl ER (XIGDUO XR) 04-999 MG TB24 Take 1 tablet daily by mouth. Due for follow up visit 06/06/17   Tandy GawBreeback, Jade L, PA-C  glipiZIDE (GLUCOTROL) 5 MG tablet Take 1 tablet (5 mg total) 2 (two) times daily before a meal by mouth. Due for follow up visit 06/06/17   Breeback, Jade L, PA-C  glucose blood test strip 1 each by Other route as directed. Use as instructed     [provider]  HYDROcodone-acetaminophen (NORCO/VICODIN) 5-325 MG tablet 1 tablet every 6 hours for severe pain. 09/21/17   Collene Gobbleaub, Steven A, MD  ibuprofen (ADVIL,MOTRIN) 800 MG tablet Take 1 tablet (800 mg total) by mouth 3 (three) times daily. 09/21/17   Collene Gobbleaub, Steven A, MD  Insulin Degludec (TRESIBA FLEXTOUCH) 200 UNIT/ML SOPN Inject 26 Units into the skin at bedtime. 12/27/16   Breeback, Jade L, PA-C  levothyroxine (SYNTHROID, LEVOTHROID) 100 MCG tablet Take  1 tablet (100 mcg total) daily before breakfast by mouth. 06/06/17   Breeback, Jade L, PA-C  lisinopril (PRINIVIL,ZESTRIL) 5 MG tablet Take 1 tablet (5 mg total) daily by mouth. 06/06/17   Breeback, Jade L, PA-C  Monolet Lancets MISC by Does not apply route as directed.      [provider]  omeprazole (PRILOSEC) 40 MG capsule Take 1 capsule (40 mg total) by mouth daily. 12/27/16   Breeback, Jade L, PA-C  Semaglutide (OZEMPIC) 0.25 or 0.5 MG/DOSE SOPN Inject 0.25 mg once a week into the skin. For 30 days then increase to .5mg  daily. 06/10/17   Breeback, Lonna Cobb,  PA-C  sildenafil (VIAGRA) 100 MG tablet Take 1 tablet (100 mg total) daily as needed by mouth for erectile dysfunction. 06/03/17   Breeback, Lonna Cobb, PA-C  Vilazodone HCl (VIIBRYD) 20 MG TABS Take 1 tablet (20 mg total) daily by mouth. 06/03/17   Jomarie Longs, PA-C    Family History Family History  Problem Relation Age of Onset  . Depression Mother   . Diabetes Mother   . Hypertension Mother   . Hyperlipidemia Mother   . Sleep apnea Maternal Uncle   . Alcohol abuse Maternal Uncle   . Diabetes Maternal Uncle   . Coronary artery disease Maternal Grandfather   . Heart attack Maternal Grandfather   . Alcohol abuse Paternal Grandfather   . Stroke Paternal Grandfather   . Diabetes Maternal Aunt   . Alcohol abuse Paternal Uncle   . Cancer Maternal Grandmother   . Diabetes Maternal Aunt     Social History Social History   Tobacco Use  . Smoking status: Former Games developer  . Smokeless tobacco: Never Used  . Tobacco comment: quit Jan 2010   Substance Use Topics  . Alcohol use: Yes    Comment: 3 etoh/week  . Drug use: No     Allergies   Patient has no known allergies.   Review of Systems Review of Systems  Constitutional: Negative.   HENT: Negative.   Eyes: Negative.   Respiratory: Negative.   Cardiovascular: Negative.   Gastrointestinal: Negative.   Endocrine:       History type 2 diabetes. He states his diabetes has been stable recently.  Genitourinary: Negative.   Musculoskeletal: Positive for back pain. Negative for neck pain and neck stiffness.       He has mid back as well as low back pain. He has some radicular pain into the right buttocks and posterior right leg.  Neurological: Negative.      Physical Exam Triage Vital Signs ED Triage Vitals  Enc Vitals Group     BP 09/21/17 1357 133/90     Pulse Rate 09/21/17 1357 81     Resp 09/21/17 1357 18     Temp 09/21/17 1357 97.9 F (36.6 C)     Temp Source 09/21/17 1357 Oral     SpO2 09/21/17 1357 97 %      Weight 09/21/17 1359 258 lb (117 kg)     Height 09/21/17 1359 6\' 1"  (1.854 m)     Head Circumference --      Peak Flow --      Pain Score 09/21/17 1358 6     Pain Loc --      Pain Edu? --      Excl. in GC? --    No data found.  Updated Vital Signs BP 133/90 (BP Location: Right Arm)   Pulse 81   Temp 97.9  F (36.6 C) (Oral)   Resp 18   Ht 6\' 1"  (1.854 m)   Wt 258 lb (117 kg)   SpO2 97%   BMI 34.04 kg/m   Visual Acuity Right Eye Distance:   Left Eye Distance:   Bilateral Distance:    Right Eye Near:   Left Eye Near:    Bilateral Near:     Physical Exam  Constitutional: He appears well-developed and well-nourished.  Eyes: Pupils are equal, round, and reactive to light.  Cardiovascular: Normal rate.  Pulmonary/Chest: Effort normal and breath sounds normal.  Musculoskeletal:  There is tenderness over the midthoracic area. There is tenderness over the lower lumbar spine worse in the right superior SI area. Straight leg raising is positive on the right at 80 and on the left at 90. Motor strength is 5 out of 5 all muscle groups. Deep tendon reflexes are 2+ and symmetrical.     UC Treatments / Results  Labs (all labs ordered are listed, but only abnormal results are displayed) Labs Reviewed - No data to display  EKG  EKG Interpretation None       Radiology No results found.  Procedures Procedures (including critical care time)  Medications Ordered in UC Medications  ketorolac (TORADOL) injection 60 mg (60 mg Intramuscular Given 09/21/17 1422)     Initial Impression / Assessment and Plan / UC Course  I have reviewed the triage vital signs and the nursing notes.  Pertinent labs & imaging results that were available during my care of the patient were reviewed by me and considered in my medical decision making (see chart for details). Physical exam remarkable for tenderness over the infrascapular and perithoracic areas and somewhat down into the lower LS  spine area. There were no focal neurological signs. He had no red flag symptoms. He was given 60 of Toradol and prescriptions for ibuprofen and #10 hydrocodone for breakthrough pain. He has not had any narcotic prescriptions for over one year on PMP aware checking.      Final Clinical Impressions(s) / UC Diagnoses   Final diagnoses:  Thoracic myofascial strain, initial encounter  Lumbosacral pain    ED Discharge Orders        Ordered    ibuprofen (ADVIL,MOTRIN) 800 MG tablet  3 times daily     09/21/17 1416    HYDROcodone-acetaminophen (NORCO/VICODIN) 5-325 MG tablet     09/21/17 1416       Controlled Substance Prescriptions [place] Controlled Substance Registry consulted? Yes, I have consulted the [place] Controlled Substances Registry for this patient, and feel the risk/benefit ratio today is favorable for proceeding with this prescription for a controlled substance.   Collene Gobble, MD 09/21/17 1430

## 2017-10-30 IMAGING — CT CT HEAD W/O CM
3 of 6 series · 13 of 47 positions shown, 15 images · non-contrast
Comparison: None.

CLINICAL DATA: 40-year-old male with eye trauma

EXAM:
CT HEAD WITHOUT CONTRAST
CT MAXILLOFACIAL WITHOUT CONTRAST
TECHNIQUE: Multidetector CT imaging of the head and maxillofacial structures
were performed using the standard protocol without intravenous
contrast. Multiplanar CT image reconstructions of the maxillofacial
structures were also generated.

[Series 4: cor head wo · coronal · 0.32mm/px · 3 of 74 slices shown]
[im 11/74  brain]
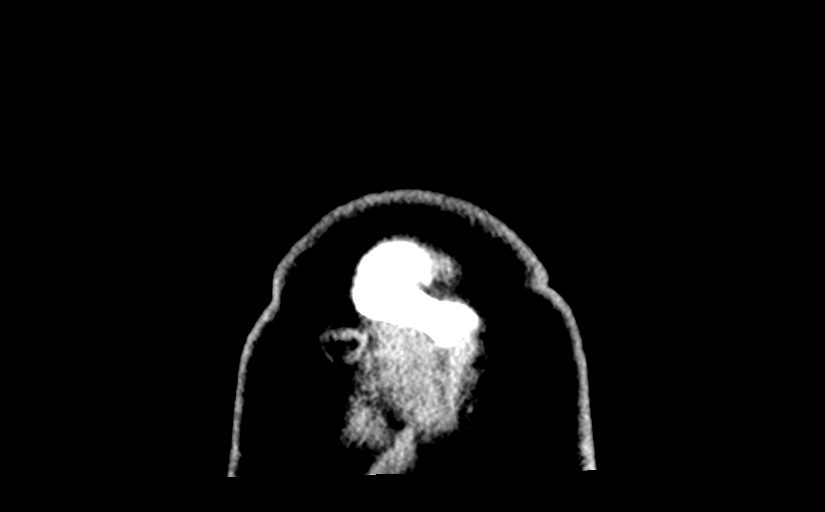
[im 16/74  brain]
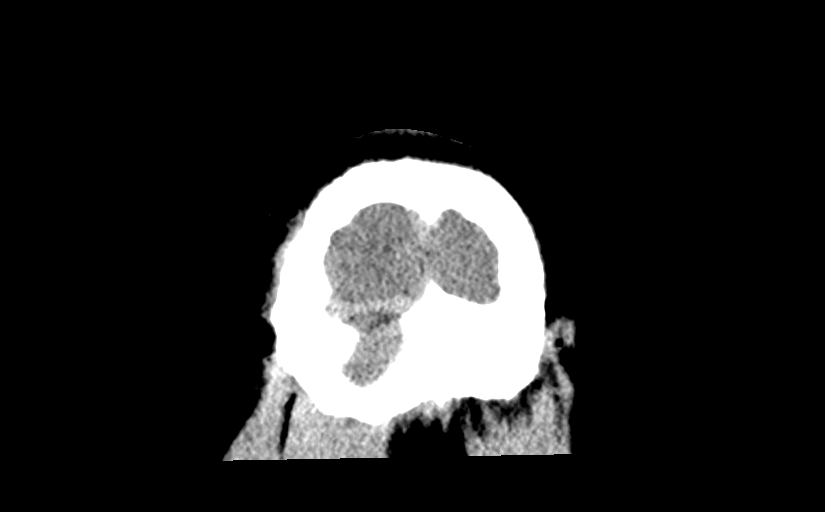
[im 21/74  brain]
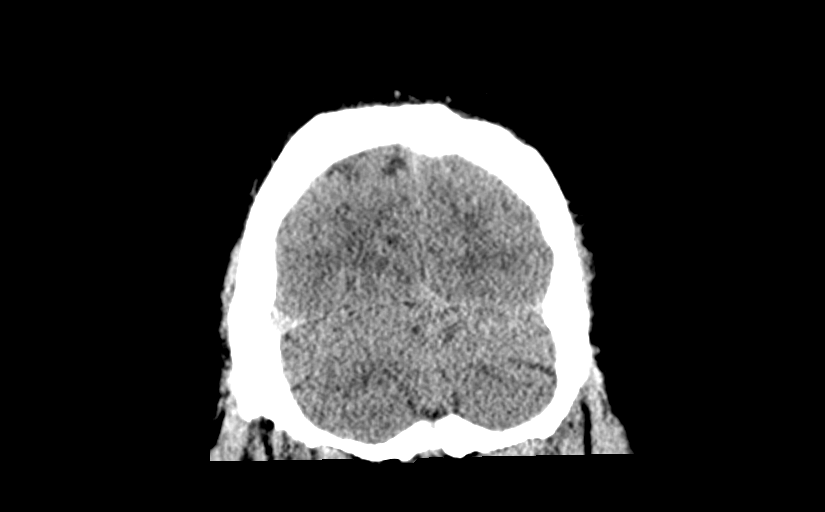

[Series 6: max soft · axial · 0.39mm/px · z∈[-242,-114]mm · 8 of 80 slices shown, 10 images]
[im 8/80  brain]
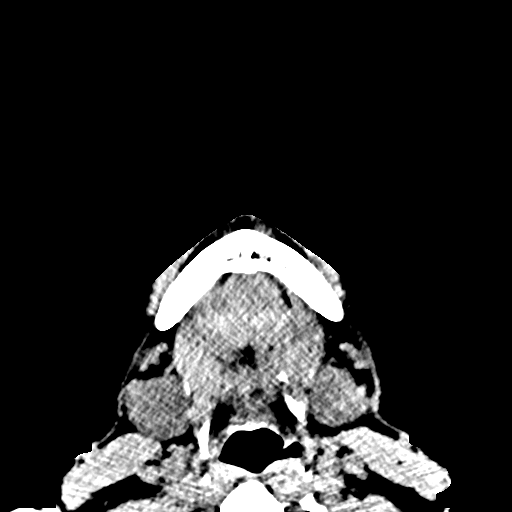
[im 8/80  bone]
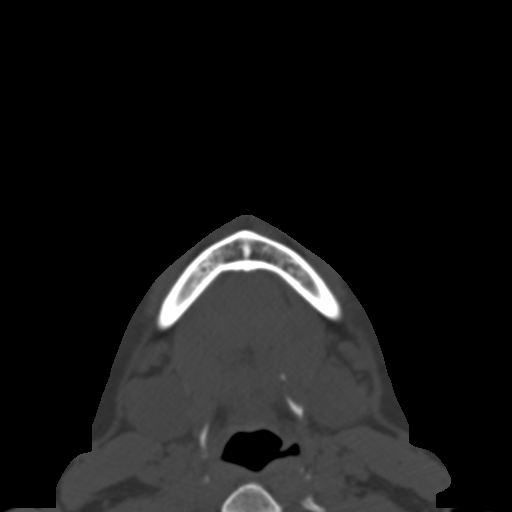
[im 16/80  brain]
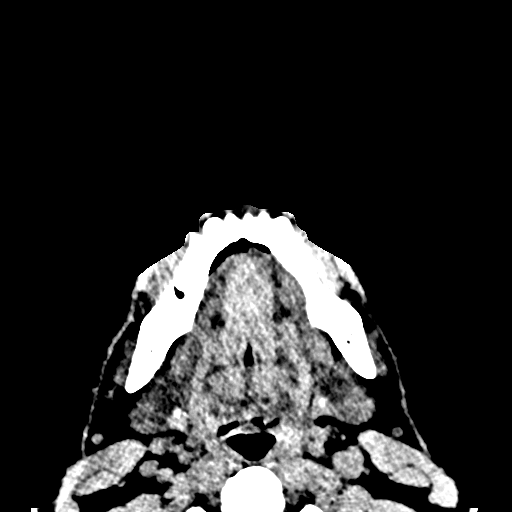
[im 27/80  brain]
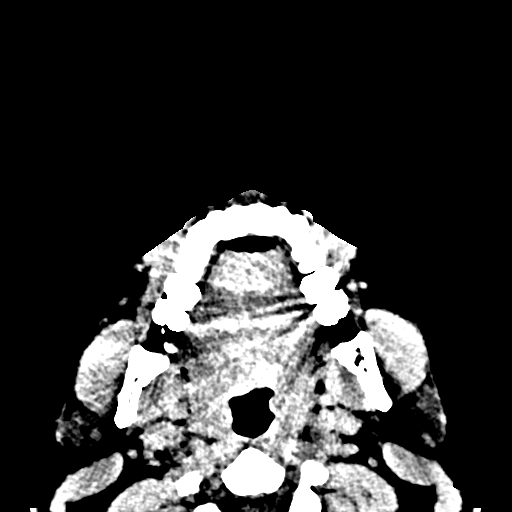
[im 34/80  brain]
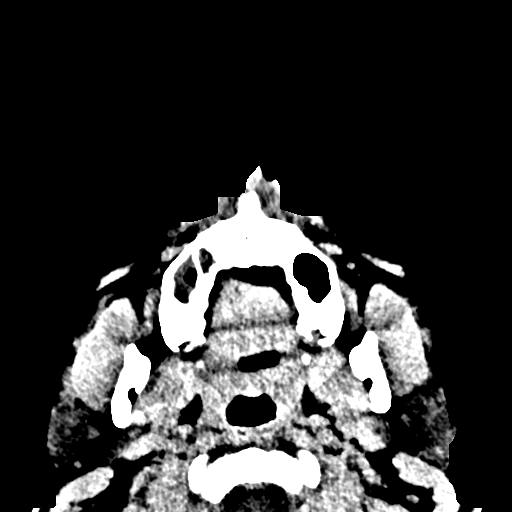
[im 46/80  brain]
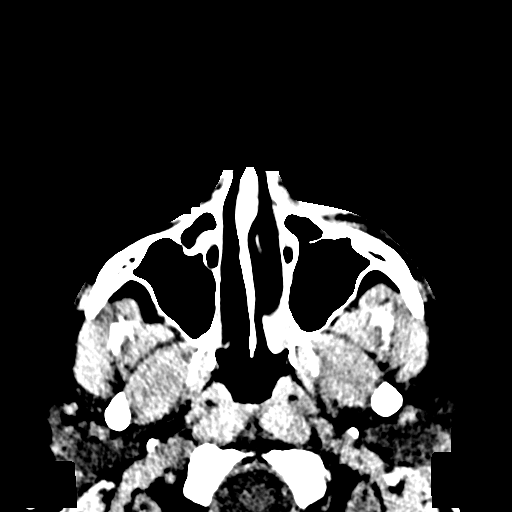
[im 46/80  bone]
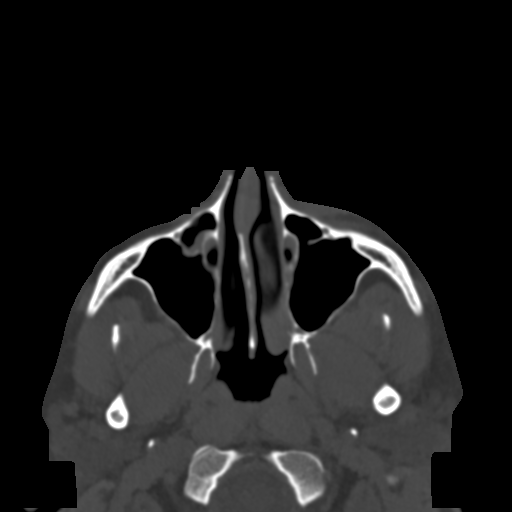
[im 53/80  brain]
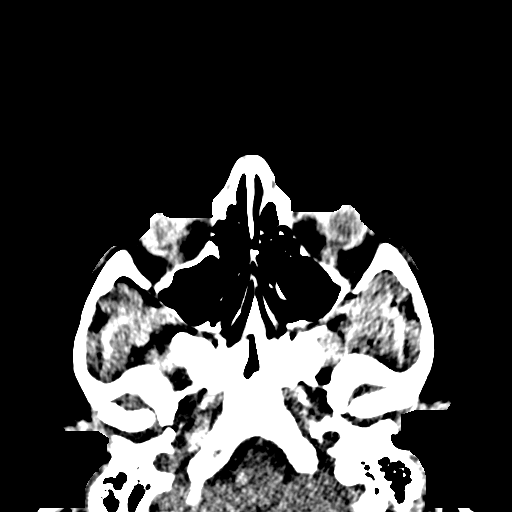
[im 64/80  brain]
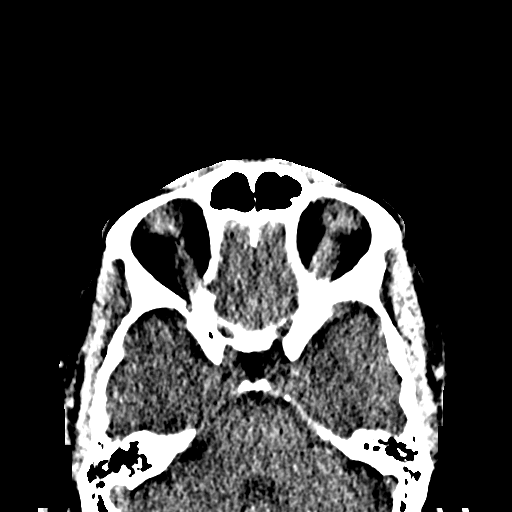
[im 72/80  brain]
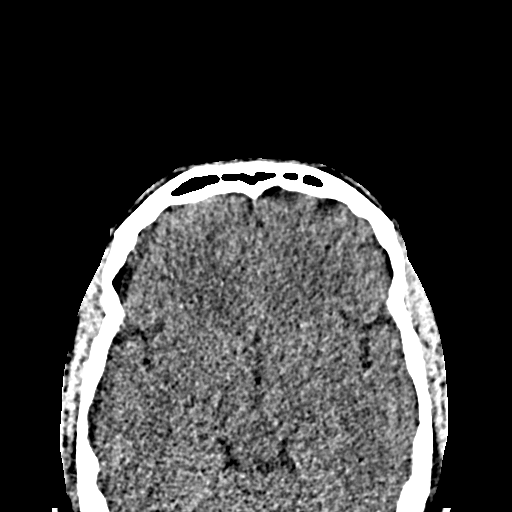

[Series 9: sagittal soft · sagittal · 0.37mm/px · 2 of 87 slices shown]
[im 29/87  brain]
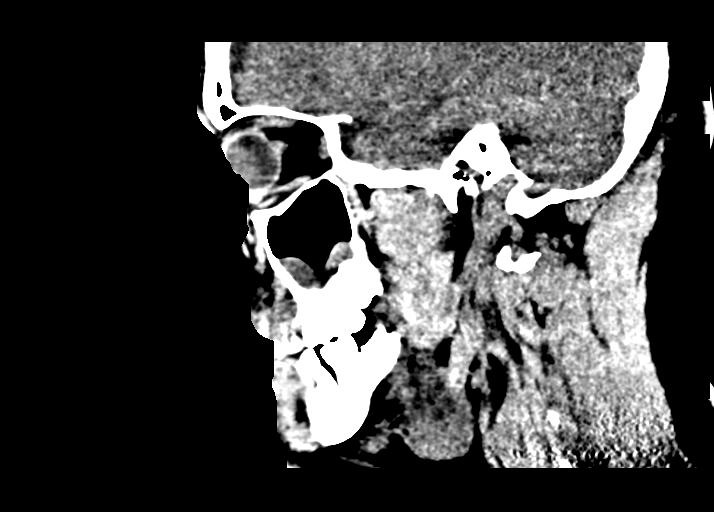
[im 58/87  brain]
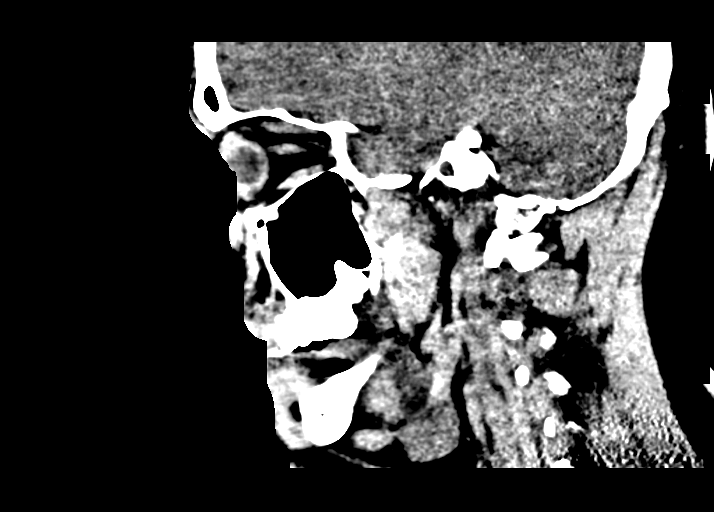

[13 of 47 positions shown; findings below may reference images not displayed]

FINDINGS: CT HEAD FINDINGS

Brain: No acute intracranial hemorrhage. No midline shift or mass
effect. Gray-white differentiation maintained. Unremarkable
appearance of the ventricular system.

Vascular: Unremarkable.

Skull: No acute fracture.  No aggressive bone lesion identified.

Sinuses/Orbits: Unremarkable appearance of the orbits. Mastoid air
cells clear. No middle ear effusion. No significant sinus disease.
No old retro bulb are hematoma or significant soft tissue swelling.

Other: None

CT MAXILLOFACIAL FINDINGS

Osseous: No fracture or mandibular dislocation. No destructive
process.

Orbits: Negative. No traumatic or inflammatory finding.

Sinuses: Minimal soft tissue opacification of the right maxillary
sinus. No significant left maxillary sinus disease. Frontal sinuses
and sphenoid sinuses are relatively clear. No significant ethmoid
air cell disease.

Soft tissues: There is mild asymmetry of the facial soft tissues on
the left in the malar soft tissues with swelling. No focal fluid
collection. No radiopaque foreign body

Endodontal disease with right mandibular and maxillary dental
caries. Periapical lucency of the right mandibular teeth.
IMPRESSION: Head CT:

No CT evidence of acute intracranial abnormality.

Maxillofacial CT:

No evidence of left globe injury.  No radiopaque foreign body.

Soft tissue swelling in the left infraorbital tissues/malar tissues
with asymmetry of the facial contour.

Endodontal disease with dental caries and periapical lucencies, as
above.

Trace paranasal sinus disease.

## 2017-12-27 ENCOUNTER — Ambulatory Visit (INDEPENDENT_AMBULATORY_CARE_PROVIDER_SITE_OTHER): Payer: BLUE CROSS/BLUE SHIELD | Admitting: Physician Assistant

## 2017-12-27 ENCOUNTER — Encounter: Payer: Self-pay | Admitting: Physician Assistant

## 2017-12-27 VITALS — BP 128/83 | HR 80 | Ht 73.0 in | Wt 245.0 lb

## 2017-12-27 DIAGNOSIS — F411 Generalized anxiety disorder: Secondary | ICD-10-CM | POA: Diagnosis not present

## 2017-12-27 DIAGNOSIS — E039 Hypothyroidism, unspecified: Secondary | ICD-10-CM

## 2017-12-27 DIAGNOSIS — F339 Major depressive disorder, recurrent, unspecified: Secondary | ICD-10-CM

## 2017-12-27 DIAGNOSIS — K4090 Unilateral inguinal hernia, without obstruction or gangrene, not specified as recurrent: Secondary | ICD-10-CM | POA: Diagnosis not present

## 2017-12-27 DIAGNOSIS — Z794 Long term (current) use of insulin: Secondary | ICD-10-CM | POA: Diagnosis not present

## 2017-12-27 DIAGNOSIS — E785 Hyperlipidemia, unspecified: Secondary | ICD-10-CM | POA: Diagnosis not present

## 2017-12-27 DIAGNOSIS — E1169 Type 2 diabetes mellitus with other specified complication: Secondary | ICD-10-CM | POA: Diagnosis not present

## 2017-12-27 DIAGNOSIS — E118 Type 2 diabetes mellitus with unspecified complications: Secondary | ICD-10-CM | POA: Diagnosis not present

## 2017-12-27 LAB — POCT GLYCOSYLATED HEMOGLOBIN (HGB A1C): Hemoglobin A1C: 14 % — AB (ref 4.0–5.6)

## 2017-12-27 MED ORDER — LISINOPRIL 5 MG PO TABS: 5.0000 mg | ORAL_TABLET | Freq: Every day | ORAL | 1 refills | Status: DC

## 2017-12-27 MED ORDER — LEVOTHYROXINE SODIUM 100 MCG PO TABS: 100.0000 ug | ORAL_TABLET | Freq: Every day | ORAL | 0 refills | Status: DC

## 2017-12-27 MED ORDER — INSULIN DEGLUDEC 200 UNIT/ML ~~LOC~~ SOPN
25.0000 [IU] | PEN_INJECTOR | Freq: Every day | SUBCUTANEOUS | 2 refills | Status: DC
Start: 2017-12-27 — End: 2019-03-26

## 2017-12-27 MED ORDER — DAPAGLIFLOZIN PRO-METFORMIN ER 10-1000 MG PO TB24: 1.0000 | ORAL_TABLET | Freq: Every day | ORAL | 0 refills | Status: DC

## 2017-12-27 MED ORDER — ATORVASTATIN CALCIUM 40 MG PO TABS: 40.0000 mg | ORAL_TABLET | Freq: Every day | ORAL | 1 refills | Status: DC

## 2017-12-27 MED ORDER — GLIPIZIDE 5 MG PO TABS: 5.0000 mg | ORAL_TABLET | Freq: Two times a day (BID) | ORAL | 0 refills | Status: DC

## 2017-12-27 MED ORDER — DULAGLUTIDE 1.5 MG/0.5ML ~~LOC~~ SOAJ: 1.0000 "application " | SUBCUTANEOUS | 2 refills | Status: DC

## 2017-12-27 MED ORDER — VILAZODONE HCL 20 MG PO TABS: 1.0000 | ORAL_TABLET | Freq: Every day | ORAL | 4 refills | Status: DC

## 2017-12-27 NOTE — Patient Instructions (Signed)
Inguinal Hernia, Adult An inguinal hernia is when fat or the intestines push through the area where the leg meets the lower belly (groin) and make a rounded lump (bulge). This condition happens over time. There are three types of inguinal hernias. These types include:  Hernias that can be pushed back into the belly (are reducible).  Hernias that cannot be pushed back into the belly (are incarcerated).  Hernias that cannot be pushed back into the belly and lose their blood supply (get strangulated). This type needs emergency surgery.  Follow these instructions at home: Lifestyle  Drink enough fluid to keep your urine (pee) clear or pale yellow.  Eat plenty of fruits, vegetables, and whole grains. These have a lot of fiber. Talk with your doctor if you have questions.  Avoid lifting heavy objects.  Avoid standing for long periods of time.  Do not use tobacco products. These include cigarettes, chewing tobacco, or e-cigarettes. If you need help quitting, ask your doctor.  Try to stay at a healthy weight. General instructions  Do not try to force the hernia back in.  Watch your hernia for any changes in color or size. Let your doctor know if there are any changes.  Take over-the-counter and prescription medicines only as told by your doctor.  Keep all follow-up visits as told by your doctor. This is important. Contact a doctor if:  You have a fever.  You have new symptoms.  Your symptoms get worse. Get help right away if:  The area where the legs meets the lower belly has: ? Pain that gets worse suddenly. ? A bulge that gets bigger suddenly and does not go down. ? A bulge that turns red or purple. ? A bulge that is painful to the touch.  You are a man and your scrotum: ? Suddenly feels painful. ? Suddenly changes in size.  You feel sick to your stomach (nauseous) and this feeling does not go away.  You throw up (vomit) and this keeps happening.  You feel your heart  beating a lot more quickly than normal.  You cannot poop (have a bowel movement) or pass gas. This information is not intended to replace advice given to you by your health care provider. Make sure you discuss any questions you have with your health care provider. Document Released: 08/12/2006 Document Revised: 12/18/2015 Document Reviewed: 05/22/2014 Elsevier Interactive Patient Education  2018 [employer] Inc[employer]  

## 2017-12-27 NOTE — Progress Notes (Signed)
Subjective:    Patient ID: Austin Weeks, male    DOB: 11-Jun-1976, 42 y.o.   MRN: [id number]  HPI  Pt is a 42 yo male with uncontrolled DM, HTN, MDD, hypothyroidism who presents to the clinic states he has noticed some pulling sensation and pain in his right lower abdomen and groin for several months with acute worsening 1 month ago when he was moving houses. At that time he noticed a mass in his right groin. The discomfort has progressively gotten worse since then. He describes the pain as a dull ache and pulling sensation that starts in his right lower abdomen, radiating through the groin into his right scrotum. He notices worsening throughout the day, with cough, and with lifting. He denies changes in bowel habits, need for manual reduction, or symptoms of strangulation. He is active at work with some lifting, no more than 55 pounds.  Diabetes - He admits to some increased stress and life changes with his recent move. He also states increased travel for work. He states this has contributed to poor diet. He states morning blood glucose levels of 120-150 and midday/evening levels of 190-200. He is active at work but does not exercise outside of that and states he has been limited by his abdominal complaint. ozempic was never approved so he did not start and ran out of tresbia.   He does feel like his depression is much better on vibryd. He denies any side effects.   He is taking synthroid daily. Last check was 6 months ago and TSH was 150.   .. Active Ambulatory Problems    Diagnosis Date Noted  . Hypothyroidism 01/06/2010  . DM 01/06/2010  . Hyperlipidemia associated with type 2 diabetes mellitus (HCC) 01/06/2010  . OBESITY, UNSPECIFIED 05/15/2009  . DEPRESSIVE DISORDER NOT ELSEWHERE CLASSIFIED 05/15/2009  . ESSENTIAL HYPERTENSION, BENIGN 01/27/2010  . FATIGUE 01/05/2010  . WEIGHT GAIN 05/15/2009  . SHORTNESS OF BREATH 01/05/2010  . CHEST PAIN, LEFT 01/05/2010  . TOBACCO USE, QUIT  05/15/2009  . Generalized anxiety disorder 04/28/2015  . Type 2 diabetes mellitus with complication (HCC) 04/28/2015  . Other specified hypothyroidism 04/28/2015  . Depression 05/01/2015  . Tooth pain 10/24/2015  . Poor dentition 05/04/2016  . GAD (generalized anxiety disorder) 06/06/2017  . Depression, recurrent (HCC) 06/06/2017  . Erectile dysfunction due to diseases classified elsewhere 06/06/2017  . Right inguinal hernia 12/27/2017   Resolved Ambulatory Problems    Diagnosis Date Noted  . No Resolved Ambulatory Problems   Past Medical History:  Diagnosis Date  . Depressed   . Diabetes mellitus without complication (HCC)   . Heart murmur   . Hypertension   . Hypothyroidism        Review of Systems  Gastrointestinal: Positive for abdominal pain. Negative for constipation and diarrhea.       Objective:   Physical Exam  Constitutional: He is oriented to person, place, and time. He appears well-developed and well-nourished. No distress.  Cardiovascular: Normal rate, regular rhythm and normal heart sounds.  Pulmonary/Chest: Effort normal and breath sounds normal.  Abdominal: Soft. Bowel sounds are normal. He exhibits mass (in right groin, visible standing and supine). He exhibits no distension. There is tenderness (right lower quadrant, right groin).  Right indirect inguinal hernia palpable at inguinal canal with cough.  Neurological: He is alert and oriented to person, place, and time.  Skin: Skin is warm and dry. He is not diaphoretic.  Psychiatric: He has a normal mood and  affect.          Assessment & Plan:   Marland Kitchen.Marland Kitchen.Austin HuaDavid was seen today for inguinal hernia and diabetes.  Diagnoses and all orders for this visit:  Type 2 diabetes mellitus with complication, with long-term current use of insulin (HCC) -     POCT HgB A1C -     Insulin Degludec (TRESIBA FLEXTOUCH) 200 UNIT/ML SOPN; Inject 26 Units into the skin at bedtime. -     Dulaglutide (TRULICITY) 1.5 MG/0.5ML  SOPN; Inject 1 application into the skin once a week. -     glipiZIDE (GLUCOTROL) 5 MG tablet; Take 1 tablet (5 mg total) by mouth 2 (two) times daily before a meal. -     Dapagliflozin-metFORMIN HCl ER (XIGDUO XR) 04-999 MG TB24; Take 1 tablet by mouth daily. -     lisinopril (PRINIVIL,ZESTRIL) 5 MG tablet; Take 1 tablet (5 mg total) by mouth daily.  Right inguinal hernia -     Ambulatory referral to General Surgery  Depression, recurrent (HCC) -     Vilazodone HCl (VIIBRYD) 20 MG TABS; Take 1 tablet (20 mg total) by mouth daily.  GAD (generalized anxiety disorder) -     Vilazodone HCl (VIIBRYD) 20 MG TABS; Take 1 tablet (20 mg total) by mouth daily.  Hypothyroidism, unspecified type -     levothyroxine (SYNTHROID, LEVOTHROID) 100 MCG tablet; Take 1 tablet (100 mcg total) by mouth daily before breakfast. -     TSH  Hyperlipidemia associated with type 2 diabetes mellitus (HCC) -     atorvastatin (LIPITOR) 40 MG tablet; Take 1 tablet (40 mg total) by mouth daily at 6 PM.   .. Lab Results  Component Value Date   HGBA1C 14.0 (A) 12/27/2017   Pt has not been compliant with medications.  Discussed importance of taking medication.  Restart tresbia and added trulicity. Discussed how to use each. Continue orals as well.  On ACE.  On STATIN.  Discussed diet. Follow up in 3 months.   Recheck TSH. Sent rx before realized needed to check. Will make adjustments accordingly.   Right indirect hernia palpated on exam. General surgery referral made. Discussed warning signs of incarceration and to call office or present to ED.   .. [facility] 12/27/2017 06/03/2017  Decreased Interest 3 2  Down, Depressed, Hopeless 0 2  PHQ - 2 Score 3 4  Altered sleeping 0 1  Tired, decreased energy 3 1  Change in appetite 0 0  Feeling bad or failure about yourself  1 0  Trouble concentrating 0 0  Moving slowly or fidgety/restless 0 2  Suicidal thoughts 0 0  PHQ-9 Score 7 8  Difficult  doing work/chores Somewhat difficult Very difficult   ... GAD 7 : Generalized Anxiety Score 12/27/2017 06/03/2017  Nervous, Anxious, on Edge 2 2  Control/stop worrying 1 1  Worry too much - different things 2 2  Trouble relaxing 0 1  Restless 0 1  Easily annoyed or irritable 3 3  Afraid - awful might happen 0 2  Total GAD 7 Score 8 12  Anxiety Difficulty Somewhat difficult -    Refilled viibryd today.   Marland Kitchen..Spent 30 minutes with patient and greater than 50 percent of visit spent counseling patient regarding treatment plan.

## 2018-01-05 DIAGNOSIS — K4090 Unilateral inguinal hernia, without obstruction or gangrene, not specified as recurrent: Secondary | ICD-10-CM | POA: Diagnosis not present

## 2018-01-05 DIAGNOSIS — E669 Obesity, unspecified: Secondary | ICD-10-CM | POA: Diagnosis not present

## 2018-01-05 DIAGNOSIS — E1169 Type 2 diabetes mellitus with other specified complication: Secondary | ICD-10-CM | POA: Diagnosis not present

## 2018-01-05 DIAGNOSIS — I10 Essential (primary) hypertension: Secondary | ICD-10-CM | POA: Diagnosis not present

## 2018-04-18 ENCOUNTER — Emergency Department (INDEPENDENT_AMBULATORY_CARE_PROVIDER_SITE_OTHER)
Admission: EM | Admit: 2018-04-18 | Discharge: 2018-04-18 | Disposition: A | Discharge status: Home / Self Care | Payer: BLUE CROSS/BLUE SHIELD | Source: Home / Self Care | Attending: Family Medicine | Admitting: Family Medicine

## 2018-04-18 ENCOUNTER — Other Ambulatory Visit: Payer: Self-pay

## 2018-04-18 DIAGNOSIS — S39012A Strain of muscle, fascia and tendon of lower back, initial encounter: Secondary | ICD-10-CM | POA: Diagnosis not present

## 2018-04-18 MED ORDER — HYDROCODONE-ACETAMINOPHEN 5-325 MG PO TABS: ORAL_TABLET | ORAL | 0 refills | Status: DC

## 2018-04-18 MED ORDER — PREDNISONE 20 MG PO TABS: ORAL_TABLET | ORAL | 0 refills | Status: DC

## 2018-04-18 MED ORDER — KETOROLAC TROMETHAMINE 60 MG/2ML IM SOLN
60.0000 mg | Freq: Once | INTRAMUSCULAR | Status: AC
  Administered 2018-04-18: 60 mg via INTRAMUSCULAR

## 2018-04-18 MED ORDER — CYCLOBENZAPRINE HCL 10 MG PO TABS: 10.0000 mg | ORAL_TABLET | Freq: Every day | ORAL | 0 refills | Status: DC

## 2018-04-18 NOTE — ED Triage Notes (Signed)
Hx of back issues. On couch Sunday, and back went out.  Has excruciating pain when walking and bending.

## 2018-04-18 NOTE — ED Provider Notes (Signed)
[facility]    CSN: [id number] Arrival date & time: 04/18/18  1913     History   Chief Complaint Chief Complaint  Patient presents with  . Back Pain    HPI Austin DawleyDavid A Califf is a 42 y.o. male.   Patient has a history of occasional recurring low back pain.  Two days ago while sitting on a couch, he reached forward to pick up a glass on a table and felt sudden sharp midline low back pain intermittently radiating to his right leg.   He denies bowel or bladder dysfunction, and no saddle numbness.  His present symptoms are similar to past episodes.  The history is provided by the patient.  Back Pain  Location:  Lumbar spine Quality:  Stabbing Radiates to:  R thigh Pain severity:  Severe Pain is:  Same all the time Onset quality:  Sudden Duration:  2 days Timing:  Constant Progression:  Unchanged Chronicity:  Recurrent Context: twisting   Relieved by:  Nothing Worsened by:  Ambulation, movement and bending Ineffective treatments:  Ibuprofen Associated symptoms: no abdominal pain, no bladder incontinence, no bowel incontinence, no dysuria, no fever, no leg pain, no numbness, no paresthesias, no pelvic pain, no perianal numbness, no tingling, no weakness and no weight loss   Risk factors: obesity     Past Medical History:  Diagnosis Date  . Depressed   . Diabetes mellitus without complication (HCC)   . Heart murmur    as a child  . Hypertension   . Hypothyroidism     Patient Active Problem List   Diagnosis Date Noted  . Right inguinal hernia 12/27/2017  . GAD (generalized anxiety disorder) 06/06/2017  . Depression, recurrent (HCC) 06/06/2017  . Erectile dysfunction due to diseases classified elsewhere 06/06/2017  . Poor dentition 05/04/2016  . Tooth pain 10/24/2015  . Depression 05/01/2015  . Generalized anxiety disorder 04/28/2015  . Type 2 diabetes mellitus with complication (HCC) 04/28/2015  . Other specified hypothyroidism 04/28/2015  . ESSENTIAL  HYPERTENSION, BENIGN 01/27/2010  . Hypothyroidism 01/06/2010  . DM 01/06/2010  . Hyperlipidemia associated with type 2 diabetes mellitus (HCC) 01/06/2010  . FATIGUE 01/05/2010  . SHORTNESS OF BREATH 01/05/2010  . CHEST PAIN, LEFT 01/05/2010  . OBESITY, UNSPECIFIED 05/15/2009  . DEPRESSIVE DISORDER NOT ELSEWHERE CLASSIFIED 05/15/2009  . WEIGHT GAIN 05/15/2009  . TOBACCO USE, QUIT 05/15/2009    Past Surgical History:  Procedure Laterality Date  . CHOLECYSTECTOMY         Home Medications    Prior to Admission medications   Medication Sig Start Date End Date Taking? Authorizing Provider  AMBULATORY NON FORMULARY MEDICATION One touch ultra 2  Testing 2 twice a day. For Diabetes Mellitus type II, uncontrolled. 04/28/15   Weeks, Austin L, PA-C  atorvastatin (LIPITOR) 40 MG tablet Take 1 tablet (40 mg total) by mouth daily at 6 PM. 12/27/17   Weeks, Austin L, PA-C  cyclobenzaprine (FLEXERIL) 10 MG tablet Take 1 tablet (10 mg total) by mouth at bedtime. 04/18/18   Lattie Weeks, Austin Peachey A, MD  Dapagliflozin-metFORMIN HCl ER (XIGDUO XR) 04-999 MG TB24 Take 1 tablet by mouth daily. 12/27/17   Weeks, Austin L, PA-C  Dulaglutide (TRULICITY) 1.5 MG/0.5ML SOPN Inject 1 application into the skin once a week. 12/27/17   Weeks, Austin L, PA-C  glipiZIDE (GLUCOTROL) 5 MG tablet Take 1 tablet (5 mg total) by mouth 2 (two) times daily before a meal. 12/27/17   Weeks, Austin L, PA-C  glucose blood  test strip 1 each by Other route as directed. Use as instructed     [provider]  HYDROcodone-acetaminophen (NORCO/VICODIN) 5-325 MG tablet Take one by mouth at bedtime as needed for pain.  May repeat in 4 to 6 hours PRN 04/18/18   Lattie Haw, MD  Insulin Degludec (TRESIBA FLEXTOUCH) 200 UNIT/ML SOPN Inject 26 Units into the skin at bedtime. 12/27/17   Austin Longs, PA-C  levothyroxine (SYNTHROID, LEVOTHROID) 100 MCG tablet Take 1 tablet (100 mcg total) by mouth daily before breakfast. 12/27/17    Weeks, Austin L, PA-C  lisinopril (PRINIVIL,ZESTRIL) 5 MG tablet Take 1 tablet (5 mg total) by mouth daily. 12/27/17   Austin Longs, PA-C  Monolet Lancets MISC by Does not apply route as directed.      [provider]  predniSONE (DELTASONE) 20 MG tablet Take one tab by mouth twice daily for 4 days, then one daily for 3 days. Take with food. 04/18/18   Lattie Haw, MD  sildenafil (VIAGRA) 100 MG tablet Take 1 tablet (100 mg total) daily as needed by mouth for erectile dysfunction. 06/03/17   Weeks, Austin Cobb, PA-C  Vilazodone HCl (VIIBRYD) 20 MG TABS Take 1 tablet (20 mg total) by mouth daily. 12/27/17   Austin Longs, PA-C    Family History Family History  Problem Relation Age of Onset  . Depression Mother   . Diabetes Mother   . Hypertension Mother   . Hyperlipidemia Mother   . Sleep apnea Maternal Uncle   . Alcohol abuse Maternal Uncle   . Diabetes Maternal Uncle   . Coronary artery disease Maternal Grandfather   . Heart attack Maternal Grandfather   . Alcohol abuse Paternal Grandfather   . Stroke Paternal Grandfather   . Diabetes Maternal Aunt   . Alcohol abuse Paternal Uncle   . Cancer Maternal Grandmother   . Diabetes Maternal Aunt     Social History Social History   Tobacco Use  . Smoking status: Former Games developer  . Smokeless tobacco: Never Used  . Tobacco comment: quit Jan 2010   Substance Use Topics  . Alcohol use: Yes    Comment: 3 etoh/week  . Drug use: No     Allergies   Patient has no known allergies.   Review of Systems Review of Systems  Constitutional: Negative for fever and weight loss.  Gastrointestinal: Negative for abdominal pain and bowel incontinence.  Genitourinary: Negative for bladder incontinence, dysuria and pelvic pain.  Musculoskeletal: Positive for back pain.  Neurological: Negative for tingling, weakness, numbness and paresthesias.  All other systems reviewed and are negative.    Physical Exam Triage Vital Signs ED  Triage Vitals  Enc Vitals Group     BP 04/18/18 2001 112/82     Pulse Rate 04/18/18 2001 (!) 110     Resp --      Temp 04/18/18 2001 98.1 F (36.7 C)     Temp Source 04/18/18 2001 Oral     SpO2 04/18/18 2001 97 %     Weight 04/18/18 2002 252 lb (114.3 kg)     Height 04/18/18 2002 6\' 1"  (1.854 m)     Head Circumference --      Peak Flow --      Pain Score 04/18/18 2002 5     Pain Loc --      Pain Edu? --      Excl. in GC? --    No data found.  Updated Vital  Signs BP 112/82 (BP Location: Right Arm)   Pulse (!) 110   Temp 98.1 F (36.7 C) (Oral)   Ht 6\' 1"  (1.854 m)   Wt 114.3 kg   SpO2 97%   BMI 33.25 kg/m   Visual Acuity Right Eye Distance:   Left Eye Distance:   Bilateral Distance:    Right Eye Near:   Left Eye Near:    Bilateral Near:     Physical Exam  Constitutional: He appears well-developed and well-nourished. No distress.  HENT:  Head: Normocephalic.  Right Ear: External ear normal.  Left Ear: External ear normal.  Nose: Nose normal.  Mouth/Throat: Oropharynx is clear and moist.  Eyes: Pupils are equal, round, and reactive to light. Conjunctivae are normal.  Neck: Normal range of motion.  Cardiovascular: Normal heart sounds.  Pulmonary/Chest: Breath sounds normal.  Abdominal: There is no tenderness.  Musculoskeletal: He exhibits no edema.       Lumbar back: He exhibits decreased range of motion and tenderness.       Back:  Back:  Can heel/toe walk and squat without difficulty.  Decreased forward flexion.  Tenderness in the midline from L3 to L4.  Straight leg raising test is positive on the right at about 45 degrees.  Sitting knee extension test is negative. Strength and sensation in the lower extremities is normal.  Patellar and achilles reflexes are normal   Neurological: He is alert.  Skin: Skin is warm and dry. No rash noted.  Nursing note and vitals reviewed.    UC Treatments / Results  Labs (all labs ordered are listed, but only abnormal  results are displayed) Labs Reviewed - No data to display  EKG None  Radiology No results found.  Procedures Procedures (including critical care time)  Medications Ordered in UC Medications  ketorolac (TORADOL) injection 60 mg (has no administration in time range)    Initial Impression / Assessment and Plan / UC Course  I have reviewed the triage vital signs and the nursing notes.  Pertinent labs & imaging results that were available during my care of the patient were reviewed by me and considered in my medical decision making (see chart for details).    Administered Toradol 60mg  IM. Rx for Flexeril 10mg  and Lortab HS prn (#10, no refill). Controlled Substance Prescriptions I have consulted the [place] Controlled Substances Registry for this patient, and feel the risk/benefit ratio today is favorable for proceeding with this prescription for a controlled substance.  Begin prednisone burst/taper. Followup with Dr. Rodney Langton or Dr. Clementeen Graham ([facility]) if not improving about two weeks.    Final Clinical Impressions(s) / UC Diagnoses   Final diagnoses:  Low back strain, initial encounter     Discharge Instructions     Apply ice pack for 20 to 30 minutes, 3 to 4 times daily  Continue until pain and swelling decrease.  Begin prednisone Wednesday 04/19/18. Begin range of motion and stretching exercises as tolerated.    ED Prescriptions    Medication Sig Dispense Auth. Provider   predniSONE (DELTASONE) 20 MG tablet Take one tab by mouth twice daily for 4 days, then one daily for 3 days. Take with food. 11 tablet Lattie Haw, MD   cyclobenzaprine (FLEXERIL) 10 MG tablet Take 1 tablet (10 mg total) by mouth at bedtime. 15 tablet Lattie Haw, MD   HYDROcodone-acetaminophen (NORCO/VICODIN) 5-325 MG tablet Take one by mouth at bedtime as needed for pain.  May repeat in  4 to 6 hours PRN 10 tablet Lattie Haw, MD        Lattie Haw,  MD 04/20/18 1501

## 2018-04-18 NOTE — Discharge Instructions (Addendum)
Apply ice pack for 20 to 30 minutes, 3 to 4 times daily  Continue until pain and swelling decrease.  Begin prednisone Wednesday 04/19/18. Begin range of motion and stretching exercises as tolerated.

## 2018-05-03 ENCOUNTER — Other Ambulatory Visit: Payer: Self-pay

## 2018-05-03 ENCOUNTER — Emergency Department (INDEPENDENT_AMBULATORY_CARE_PROVIDER_SITE_OTHER)
Admission: EM | Admit: 2018-05-03 | Discharge: 2018-05-03 | Disposition: A | Discharge status: Home / Self Care | Payer: BLUE CROSS/BLUE SHIELD | Source: Home / Self Care | Attending: Family Medicine | Admitting: Family Medicine

## 2018-05-03 ENCOUNTER — Encounter: Payer: Self-pay | Admitting: *Deleted

## 2018-05-03 DIAGNOSIS — M549 Dorsalgia, unspecified: Secondary | ICD-10-CM

## 2018-05-03 DIAGNOSIS — G8929 Other chronic pain: Secondary | ICD-10-CM | POA: Diagnosis not present

## 2018-05-03 MED ORDER — MELOXICAM 15 MG PO TABS: 15.0000 mg | ORAL_TABLET | Freq: Every day | ORAL | 0 refills | Status: DC

## 2018-05-03 MED ORDER — KETOROLAC TROMETHAMINE 60 MG/2ML IM SOLN
60.0000 mg | Freq: Once | INTRAMUSCULAR | Status: AC
  Administered 2018-05-03: 60 mg via INTRAMUSCULAR

## 2018-05-03 MED ORDER — METHOCARBAMOL 500 MG PO TABS: 500.0000 mg | ORAL_TABLET | Freq: Two times a day (BID) | ORAL | 0 refills | Status: DC

## 2018-05-03 NOTE — ED Triage Notes (Signed)
Pt c/o mid back pain x 1 day with spasms in his low back. No OTC meds. He does not recall an injury.

## 2018-05-03 NOTE — ED Provider Notes (Signed)
[facility]    CSN: [id number] Arrival date & time: 05/03/18  1136     History   Chief Complaint Chief Complaint  Patient presents with  . Back Pain    HPI Austin [facility]eks is a 42 y.o. male.   HPI Austin [facility] is a 42 y.o. male presenting to UC with hx of recurrent back pain c/o exacerbation of mid to lower back pain since yesterday. No specific known injury. Pain is aching and sore.  Worse with certain movements. He was tx with Toradol injection about 2 weeks ago at [facility] for same, which provided temporary relief. He has not f/u with PCP or Sports Medicine. No medication taken PTA. Denies numbness or weakness in arms or legs. No change in bowel or bladder habits.    Past Medical History:  Diagnosis Date  . Depressed   . Diabetes mellitus without complication (HCC)   . Heart murmur    as a child  . Hypertension   . Hypothyroidism     Patient Active Problem List   Diagnosis Date Noted  . Right inguinal hernia 12/27/2017  . GAD (generalized anxiety disorder) 06/06/2017  . Depression, recurrent (HCC) 06/06/2017  . Erectile dysfunction due to diseases classified elsewhere 06/06/2017  . Poor dentition 05/04/2016  . Tooth pain 10/24/2015  . Depression 05/01/2015  . Generalized anxiety disorder 04/28/2015  . Type 2 diabetes mellitus with complication (HCC) 04/28/2015  . Other specified hypothyroidism 04/28/2015  . ESSENTIAL HYPERTENSION, BENIGN 01/27/2010  . Hypothyroidism 01/06/2010  . DM 01/06/2010  . Hyperlipidemia associated with type 2 diabetes mellitus (HCC) 01/06/2010  . FATIGUE 01/05/2010  . SHORTNESS OF BREATH 01/05/2010  . CHEST PAIN, LEFT 01/05/2010  . OBESITY, UNSPECIFIED 05/15/2009  . DEPRESSIVE DISORDER NOT ELSEWHERE CLASSIFIED 05/15/2009  . WEIGHT GAIN 05/15/2009  . TOBACCO USE, QUIT 05/15/2009    Past Surgical History:  Procedure Laterality Date  . CHOLECYSTECTOMY         Home Medications    Prior to Admission medications     Medication Sig Start Date End Date Taking? Authorizing Provider  AMBULATORY NON FORMULARY MEDICATION One touch ultra 2  Testing 2 twice a day. For Diabetes Mellitus type II, uncontrolled. 04/28/15   Breeback, Jade L, PA-C  atorvastatin (LIPITOR) 40 MG tablet Take 1 tablet (40 mg total) by mouth daily at 6 PM. 12/27/17   Breeback, Jade L, PA-C  cyclobenzaprine (FLEXERIL) 10 MG tablet Take 1 tablet (10 mg total) by mouth at bedtime. 04/18/18   Lattie Haw, MD  Dapagliflozin-metFORMIN HCl ER (XIGDUO XR) 04-999 MG TB24 Take 1 tablet by mouth daily. 12/27/17   Breeback, Jade L, PA-C  Dulaglutide (TRULICITY) 1.5 MG/0.5ML SOPN Inject 1 application into the skin once a week. 12/27/17   Breeback, Jade L, PA-C  glipiZIDE (GLUCOTROL) 5 MG tablet Take 1 tablet (5 mg total) by mouth 2 (two) times daily before a meal. 12/27/17   Breeback, Jade L, PA-C  glucose blood test strip 1 each by Other route as directed. Use as instructed     [provider]  HYDROcodone-acetaminophen (NORCO/VICODIN) 5-325 MG tablet Take one by mouth at bedtime as needed for pain.  May repeat in 4 to 6 hours PRN 04/18/18   Lattie Haw, MD  Insulin Degludec (TRESIBA FLEXTOUCH) 200 UNIT/ML SOPN Inject 26 Units into the skin at bedtime. 12/27/17   Breeback, Lonna Cobb, PA-C  levothyroxine (SYNTHROID, LEVOTHROID) 100 MCG tablet Take 1 tablet (100 mcg total) by mouth  daily before breakfast. 12/27/17   Breeback, Jade L, PA-C  lisinopril (PRINIVIL,ZESTRIL) 5 MG tablet Take 1 tablet (5 mg total) by mouth daily. 12/27/17   Breeback, Lonna Cobb, PA-C  meloxicam (MOBIC) 15 MG tablet Take 1 tablet (15 mg total) by mouth daily. 05/03/18   Lurene Shadow, PA-C  methocarbamol (ROBAXIN) 500 MG tablet Take 1 tablet (500 mg total) by mouth 2 (two) times daily. 05/03/18   Lurene Shadow, PA-C  Monolet Lancets MISC by Does not apply route as directed.      [provider]  predniSONE (DELTASONE) 20 MG tablet Take one tab by mouth twice daily for 4 days,  then one daily for 3 days. Take with food. 04/18/18   Lattie Haw, MD  sildenafil (VIAGRA) 100 MG tablet Take 1 tablet (100 mg total) daily as needed by mouth for erectile dysfunction. 06/03/17   Breeback, Lonna Cobb, PA-C  Vilazodone HCl (VIIBRYD) 20 MG TABS Take 1 tablet (20 mg total) by mouth daily. 12/27/17   Jomarie Longs, PA-C    Family History Family History  Problem Relation Age of Onset  . Depression Mother   . Diabetes Mother   . Hypertension Mother   . Hyperlipidemia Mother   . Sleep apnea Maternal Uncle   . Alcohol abuse Maternal Uncle   . Diabetes Maternal Uncle   . Coronary artery disease Maternal Grandfather   . Heart attack Maternal Grandfather   . Alcohol abuse Paternal Grandfather   . Stroke Paternal Grandfather   . Diabetes Maternal Aunt   . Alcohol abuse Paternal Uncle   . Cancer Maternal Grandmother   . Diabetes Maternal Aunt     Social History Social History   Tobacco Use  . Smoking status: Former Games developer  . Smokeless tobacco: Never Used  . Tobacco comment: quit Jan 2010   Substance Use Topics  . Alcohol use: Yes    Comment: 3 etoh/week  . Drug use: No     Allergies   Patient has no known allergies.   Review of Systems Review of Systems  Constitutional: Negative for chills and fever.  Genitourinary: Negative for dysuria, flank pain, frequency and hematuria.  Musculoskeletal: Positive for back pain and myalgias. Negative for neck pain and neck stiffness.     Physical Exam Triage Vital Signs ED Triage Vitals [05/03/18 1200]  Enc Vitals Group     BP (!) 158/101     Pulse Rate 88     Resp 18     Temp 98.1 F (36.7 C)     Temp Source Oral     SpO2 99 %     Weight 252 lb (114.3 kg)     Height 6\' 1"  (1.854 m)     Head Circumference      Peak Flow      Pain Score 6     Pain Loc      Pain Edu?      Excl. in GC?    No data found.  Updated Vital Signs BP (!) 158/101 (BP Location: Right Arm)   Pulse 88   Temp 98.1 F (36.7 C)  (Oral)   Resp 18   Ht 6\' 1"  (1.854 m)   Wt 252 lb (114.3 kg)   SpO2 99%   BMI 33.25 kg/m   Visual Acuity Right Eye Distance:   Left Eye Distance:   Bilateral Distance:    Right Eye Near:   Left Eye Near:    Bilateral Near:  Physical Exam  Constitutional: He is oriented to person, place, and time. He appears well-developed and well-nourished. No distress.  HENT:  Head: Normocephalic and atraumatic.  Mouth/Throat: Oropharynx is clear and moist.  Eyes: EOM are normal.  Neck: Normal range of motion.  Cardiovascular: Normal rate and regular rhythm.  Pulmonary/Chest: Effort normal. No respiratory distress.  Musculoskeletal: Normal range of motion. He exhibits tenderness. He exhibits no edema.  No midline spinal tenderness. Bilateral mid to lower thoracic muscle and lumbar tenderness. Negative straight leg raise. Normal gait.   Neurological: He is alert and oriented to person, place, and time.  Skin: Skin is warm and dry. No rash noted. He is not diaphoretic. No erythema.  Psychiatric: He has a normal mood and affect. His behavior is normal.  Nursing note and vitals reviewed.    UC Treatments / Results  Labs (all labs ordered are listed, but only abnormal results are displayed) Labs Reviewed - No data to display  EKG None  Radiology No results found.  Procedures Procedures (including critical care time)  Medications Ordered in UC Medications  ketorolac (TORADOL) injection 60 mg (60 mg Intramuscular Given 05/03/18 1248)    Initial Impression / Assessment and Plan / UC Course  I have reviewed the triage vital signs and the nursing notes.  Pertinent labs & imaging results that were available during my care of the patient were reviewed by me and considered in my medical decision making (see chart for details).     Exacerbation of chronic back pain w/o known injury. No red flag symptoms No indication for imaging at this time. Encouraged symptomatic  treatment.  Final Clinical Impressions(s) / UC Diagnoses   Final diagnoses:  Exacerbation of chronic back pain     Discharge Instructions      Robaxin (methocarbamol) is a muscle relaxer and may cause drowsiness. Do not drink alcohol, drive, or operate heavy machinery while taking.  Meloxicam (Mobic) is an antiinflammatory to help with pain and inflammation.  Do not take ibuprofen, Advil, Aleve, or any other medications that contain NSAIDs while taking meloxicam as this may cause stomach upset or even ulcers if taken in large amounts for an extended period of time.   Please call TODAY to schedule a follow up appointment with Sports Medicine Next Week for further evaluation and treatment of recurrent back pain.     ED Prescriptions    Medication Sig Dispense Auth. Provider   methocarbamol (ROBAXIN) 500 MG tablet Take 1 tablet (500 mg total) by mouth 2 (two) times daily. 20 tablet Lurene Shadow, PA-C   meloxicam (MOBIC) 15 MG tablet Take 1 tablet (15 mg total) by mouth daily. 20 tablet Lurene Shadow, PA-C     Controlled Substance Prescriptions [place] Controlled Substance Registry consulted? Not Applicable   Rolla Plate 05/03/18 1610

## 2018-05-03 NOTE — Discharge Instructions (Signed)
°  Robaxin (methocarbamol) is a muscle relaxer and may cause drowsiness. Do not drink alcohol, drive, or operate heavy machinery while taking.  Meloxicam (Mobic) is an antiinflammatory to help with pain and inflammation.  Do not take ibuprofen, Advil, Aleve, or any other medications that contain NSAIDs while taking meloxicam as this may cause stomach upset or even ulcers if taken in large amounts for an extended period of time.   Please call TODAY to schedule a follow up appointment with Sports Medicine Next Week for further evaluation and treatment of recurrent back pain.

## 2018-05-10 ENCOUNTER — Other Ambulatory Visit: Payer: Self-pay | Admitting: Physician Assistant

## 2018-05-10 DIAGNOSIS — E039 Hypothyroidism, unspecified: Secondary | ICD-10-CM

## 2018-05-10 DIAGNOSIS — E118 Type 2 diabetes mellitus with unspecified complications: Secondary | ICD-10-CM

## 2018-05-10 DIAGNOSIS — Z794 Long term (current) use of insulin: Secondary | ICD-10-CM

## 2018-05-26 ENCOUNTER — Other Ambulatory Visit: Payer: Self-pay | Admitting: Physician Assistant

## 2018-05-26 DIAGNOSIS — E039 Hypothyroidism, unspecified: Secondary | ICD-10-CM

## 2018-05-29 ENCOUNTER — Other Ambulatory Visit: Payer: Self-pay

## 2018-05-29 DIAGNOSIS — E039 Hypothyroidism, unspecified: Secondary | ICD-10-CM

## 2018-05-29 MED ORDER — LEVOTHYROXINE SODIUM 100 MCG PO TABS: 100.0000 ug | ORAL_TABLET | Freq: Every day | ORAL | 0 refills | Status: DC

## 2018-06-23 ENCOUNTER — Other Ambulatory Visit: Payer: Self-pay | Admitting: Physician Assistant

## 2018-06-23 DIAGNOSIS — N521 Erectile dysfunction due to diseases classified elsewhere: Secondary | ICD-10-CM

## 2018-07-05 ENCOUNTER — Other Ambulatory Visit: Payer: Self-pay | Admitting: Physician Assistant

## 2018-07-05 DIAGNOSIS — N521 Erectile dysfunction due to diseases classified elsewhere: Secondary | ICD-10-CM

## 2018-10-05 ENCOUNTER — Other Ambulatory Visit: Payer: Self-pay

## 2018-10-05 ENCOUNTER — Emergency Department (INDEPENDENT_AMBULATORY_CARE_PROVIDER_SITE_OTHER)
Admission: EM | Admit: 2018-10-05 | Discharge: 2018-10-05 | Disposition: A | Discharge status: Home / Self Care | Payer: BLUE CROSS/BLUE SHIELD | Source: Home / Self Care

## 2018-10-05 DIAGNOSIS — R6889 Other general symptoms and signs: Secondary | ICD-10-CM

## 2018-10-05 LAB — POCT INFLUENZA A/B
Influenza A, POC: NEGATIVE
Influenza B, POC: NEGATIVE

## 2018-10-05 NOTE — Discharge Instructions (Signed)
  You may take 500mg acetaminophen every 4-6 hours or in combination with ibuprofen 400-600mg every 6-8 hours as needed for pain, inflammation, and fever.  Be sure to well hydrated with clear liquids and get at least 8 hours of sleep at night, preferably more while sick.   Please follow up with family medicine in 1 week if needed.   

## 2018-10-05 NOTE — ED Provider Notes (Signed)
[facility]    CSN: [id number] Arrival date & time: 10/05/18  1034     History   Chief Complaint Chief Complaint  Patient presents with  . flu like sxs    HPI Austin Weeks is a 43 y.o. male.   HPI  Austin Weeks is a 43 y.o. male presenting to UC with c/o cough that started 2 days ago, associated congestion, HA, body aches and chills. He has taken Dayquil as needed with mild relief. Pt has 3 children and hopes not to get them sick. He is not sure if he got the flu vaccine this season. Denies n/v/d. No chest pain or SOB.   Past Medical History:  Diagnosis Date  . Depressed   . Diabetes mellitus without complication (HCC)   . Heart murmur    as a child  . Hypertension   . Hypothyroidism     Patient Active Problem List   Diagnosis Date Noted  . Right inguinal hernia 12/27/2017  . GAD (generalized anxiety disorder) 06/06/2017  . Depression, recurrent (HCC) 06/06/2017  . Erectile dysfunction due to diseases classified elsewhere 06/06/2017  . Poor dentition 05/04/2016  . Tooth pain 10/24/2015  . Depression 05/01/2015  . Generalized anxiety disorder 04/28/2015  . Type 2 diabetes mellitus with complication (HCC) 04/28/2015  . Other specified hypothyroidism 04/28/2015  . ESSENTIAL HYPERTENSION, BENIGN 01/27/2010  . Hypothyroidism 01/06/2010  . DM 01/06/2010  . Hyperlipidemia associated with type 2 diabetes mellitus (HCC) 01/06/2010  . FATIGUE 01/05/2010  . SHORTNESS OF BREATH 01/05/2010  . CHEST PAIN, LEFT 01/05/2010  . OBESITY, UNSPECIFIED 05/15/2009  . DEPRESSIVE DISORDER NOT ELSEWHERE CLASSIFIED 05/15/2009  . WEIGHT GAIN 05/15/2009  . TOBACCO USE, QUIT 05/15/2009    Past Surgical History:  Procedure Laterality Date  . CHOLECYSTECTOMY         Home Medications    Prior to Admission medications   Medication Sig Start Date End Date Taking? Authorizing Provider  AMBULATORY NON FORMULARY MEDICATION One touch ultra 2  Testing 2 twice a day. For  Diabetes Mellitus type II, uncontrolled. 04/28/15   Breeback, Jade L, PA-C  atorvastatin (LIPITOR) 40 MG tablet Take 1 tablet (40 mg total) by mouth daily at 6 PM. 12/27/17   Breeback, Jade L, PA-C  cyclobenzaprine (FLEXERIL) 10 MG tablet Take 1 tablet (10 mg total) by mouth at bedtime. 04/18/18   Lattie Haw, MD  Dapagliflozin-metFORMIN HCl ER (XIGDUO XR) 04-999 MG TB24 Take 1 tablet by mouth daily. 12/27/17   Breeback, Jade L, PA-C  Dulaglutide (TRULICITY) 1.5 MG/0.5ML SOPN Inject 1 application into the skin once a week. 12/27/17   Breeback, Jade L, PA-C  glipiZIDE (GLUCOTROL) 5 MG tablet Take 1 tablet (5 mg total) by mouth 2 (two) times daily before a meal. 12/27/17   Breeback, Jade L, PA-C  glucose blood test strip 1 each by Other route as directed. Use as instructed     [provider]  HYDROcodone-acetaminophen (NORCO/VICODIN) 5-325 MG tablet Take one by mouth at bedtime as needed for pain.  May repeat in 4 to 6 hours PRN 04/18/18   Lattie Haw, MD  Insulin Degludec (TRESIBA FLEXTOUCH) 200 UNIT/ML SOPN Inject 26 Units into the skin at bedtime. 12/27/17   Jomarie Longs, PA-C  levothyroxine (SYNTHROID, LEVOTHROID) 100 MCG tablet Take 1 tablet (100 mcg total) by mouth daily before breakfast. 12/27/17   Breeback, Jade L, PA-C  levothyroxine (SYNTHROID, LEVOTHROID) 100 MCG tablet Take 1 tablet (100 mcg  total) by mouth daily before breakfast. Patient must make a follow-up with PCP for any future refills. 05/29/18   Breeback, Jade L, PA-C  lisinopril (PRINIVIL,ZESTRIL) 5 MG tablet Take 1 tablet (5 mg total) by mouth daily. 12/27/17   Breeback, Lonna CobbJade L, PA-C  meloxicam (MOBIC) 15 MG tablet Take 1 tablet (15 mg total) by mouth daily. 05/03/18   Lurene ShadowPhelps, Clay Solum O, PA-C  methocarbamol (ROBAXIN) 500 MG tablet Take 1 tablet (500 mg total) by mouth 2 (two) times daily. 05/03/18   Lurene ShadowPhelps, Rosaleigh Brazzel O, PA-C  Monolet Lancets MISC by Does not apply route as directed.      [provider]  predniSONE  (DELTASONE) 20 MG tablet Take one tab by mouth twice daily for 4 days, then one daily for 3 days. Take with food. 04/18/18   Lattie HawBeese, Stephen A, MD  sildenafil (VIAGRA) 100 MG tablet Take 1 tablet (100 mg total) daily as needed by mouth for erectile dysfunction. 06/03/17   Breeback, Lonna CobbJade L, PA-C  Vilazodone HCl (VIIBRYD) 20 MG TABS Take 1 tablet (20 mg total) by mouth daily. 12/27/17   Jomarie LongsBreeback, Jade L, PA-C    Family History Family History  Problem Relation Age of Onset  . Depression Mother   . Diabetes Mother   . Hypertension Mother   . Hyperlipidemia Mother   . Sleep apnea Maternal Uncle   . Alcohol abuse Maternal Uncle   . Diabetes Maternal Uncle   . Coronary artery disease Maternal Grandfather   . Heart attack Maternal Grandfather   . Alcohol abuse Paternal Grandfather   . Stroke Paternal Grandfather   . Diabetes Maternal Aunt   . Alcohol abuse Paternal Uncle   . Cancer Maternal Grandmother   . Diabetes Maternal Aunt     Social History Social History   Tobacco Use  . Smoking status: Former Games developermoker  . Smokeless tobacco: Never Used  . Tobacco comment: quit Jan 2010   Substance Use Topics  . Alcohol use: Yes    Comment: 3 etoh/week  . Drug use: No     Allergies   Patient has no known allergies.   Review of Systems Review of Systems  Constitutional: Positive for chills, fatigue and fever (subjective).  HENT: Positive for congestion and sore throat. Negative for ear pain, trouble swallowing and voice change.   Respiratory: Positive for cough. Negative for shortness of breath.   Cardiovascular: Negative for chest pain and palpitations.  Gastrointestinal: Negative for abdominal pain, diarrhea, nausea and vomiting.  Musculoskeletal: Positive for arthralgias, back pain and myalgias.  Skin: Negative for rash.  Neurological: Positive for headaches.     Physical Exam Triage Vital Signs ED Triage Vitals  Enc Vitals Group     BP 10/05/18 1049 (!) 135/94     Pulse Rate  10/05/18 1049 94     Resp 10/05/18 1049 18     Temp 10/05/18 1049 (!) 97.5 F (36.4 C)     Temp Source 10/05/18 1049 Oral     SpO2 10/05/18 1049 98 %     Weight 10/05/18 1050 250 lb (113.4 kg)     Height 10/05/18 1050 6\' 1"  (1.854 m)     Head Circumference --      Peak Flow --      Pain Score 10/05/18 1050 0     Pain Loc --      Pain Edu? --      Excl. in GC? --    No data found.  Updated Vital  Signs BP (!) 135/94 (BP Location: Right Arm)   Pulse 94   Temp (!) 97.5 F (36.4 C) (Oral)   Resp 18   Ht 6\' 1"  (1.854 m)   Wt 250 lb (113.4 kg)   SpO2 98%   BMI 32.98 kg/m   Visual Acuity Right Eye Distance:   Left Eye Distance:   Bilateral Distance:    Right Eye Near:   Left Eye Near:    Bilateral Near:     Physical Exam Vitals signs and nursing note reviewed.  Constitutional:      Appearance: Normal appearance. He is well-developed.  HENT:     Head: Normocephalic and atraumatic.     Right Ear: Tympanic membrane normal.     Left Ear: Tympanic membrane normal.     Nose: Nose normal.     Mouth/Throat:     Lips: Pink.     Mouth: Mucous membranes are moist.     Pharynx: Oropharynx is clear. Uvula midline.  Neck:     Musculoskeletal: Normal range of motion.  Cardiovascular:     Rate and Rhythm: Normal rate and regular rhythm.  Pulmonary:     Effort: Pulmonary effort is normal. No respiratory distress.     Breath sounds: Normal breath sounds. No stridor. No wheezing or rhonchi.  Musculoskeletal: Normal range of motion.  Skin:    General: Skin is warm and dry.  Neurological:     Mental Status: He is alert and oriented to person, place, and time.  Psychiatric:        Behavior: Behavior normal.      UC Treatments / Results  Labs (all labs ordered are listed, but only abnormal results are displayed) Labs Reviewed  POCT INFLUENZA A/B    EKG None  Radiology No results found.  Procedures Procedures (including critical care time)  Medications Ordered in  UC Medications - No data to display  Initial Impression / Assessment and Plan / UC Course  I have reviewed the triage vital signs and the nursing notes.  Pertinent labs & imaging results that were available during my care of the patient were reviewed by me and considered in my medical decision making (see chart for details).     Rapid flu: NEGATIVE Encouraged symptomatic tx for viral illness F/u with PCP as needed  Final Clinical Impressions(s) / UC Diagnoses   Final diagnoses:  Flu-like symptoms     Discharge Instructions      You may take 500mg  acetaminophen every 4-6 hours or in combination with ibuprofen 400-600mg  every 6-8 hours as needed for pain, inflammation, and fever.  Be sure to well hydrated with clear liquids and get at least 8 hours of sleep at night, preferably more while sick.   Please follow up with family medicine in 1 week if needed.     ED Prescriptions    None     Controlled Substance Prescriptions [place] Controlled Substance Registry consulted? Not Applicable   Rolla Plate 10/05/18 1143

## 2018-10-05 NOTE — ED Triage Notes (Signed)
Pt c/o cough that started a couple days ago. Headache, bodyaches and chills as well. Some congestion. Taking dayquil prn.

## 2018-12-15 ENCOUNTER — Other Ambulatory Visit: Payer: Self-pay

## 2018-12-15 ENCOUNTER — Emergency Department (INDEPENDENT_AMBULATORY_CARE_PROVIDER_SITE_OTHER)
Admission: EM | Admit: 2018-12-15 | Discharge: 2018-12-15 | Disposition: A | Discharge status: Home / Self Care | Payer: BLUE CROSS/BLUE SHIELD | Source: Home / Self Care | Attending: Family Medicine | Admitting: Family Medicine

## 2018-12-15 DIAGNOSIS — L0211 Cutaneous abscess of neck: Secondary | ICD-10-CM

## 2018-12-15 MED ORDER — DOXYCYCLINE HYCLATE 100 MG PO CAPS: 100.0000 mg | ORAL_CAPSULE | Freq: Two times a day (BID) | ORAL | 0 refills | Status: DC

## 2018-12-15 MED ORDER — HYDROCODONE-ACETAMINOPHEN 5-325 MG PO TABS: 1.0000 | ORAL_TABLET | Freq: Four times a day (QID) | ORAL | 0 refills | Status: DC | PRN

## 2018-12-15 MED ORDER — IBUPROFEN 600 MG PO TABS
600.0000 mg | ORAL_TABLET | Freq: Once | ORAL | Status: AC
  Administered 2018-12-15: 600 mg via ORAL

## 2018-12-15 NOTE — ED Provider Notes (Signed)
[facility]    CSN: [id number] Arrival date & time: 12/15/18  1219     History   Chief Complaint Chief Complaint  Patient presents with  . Cyst    back of neck    HPI [facility]eks is a 43 y.o. male.   Patient developed a small painful bump on the back of his neck about one week ago that has become larger and more painful during the past 3 days.  He had chills two nights ago and the neck pain keeps him awake at night.  The history is provided by the patient.  Abscess  Abscess location: posterior neck. Size:  2cm Abscess quality: fluctuance, induration, painful and redness   Abscess quality: not draining and not weeping   Red streaking: no   Duration:  1 week Progression:  Worsening Pain details:    Quality:  Throbbing and pressure   Severity:  Severe   Duration:  3 days   Timing:  Constant   Progression:  Worsening Chronicity:  New Context: not skin injury   Relieved by:  Nothing Exacerbated by: contact. Ineffective treatments:  None tried Associated symptoms: headaches   Associated symptoms: no fatigue, no fever and no nausea   Associated symptoms comment:  Chills   Past Medical History:  Diagnosis Date  . Depressed   . Diabetes mellitus without complication (HCC)   . Heart murmur    as a child  . Hypertension   . Hypothyroidism     Patient Active Problem List   Diagnosis Date Noted  . Right inguinal hernia 12/27/2017  . GAD (generalized anxiety disorder) 06/06/2017  . Depression, recurrent (HCC) 06/06/2017  . Erectile dysfunction due to diseases classified elsewhere 06/06/2017  . Poor dentition 05/04/2016  . Tooth pain 10/24/2015  . Depression 05/01/2015  . Generalized anxiety disorder 04/28/2015  . Type 2 diabetes mellitus with complication (HCC) 04/28/2015  . Other specified hypothyroidism 04/28/2015  . ESSENTIAL HYPERTENSION, BENIGN 01/27/2010  . Hypothyroidism 01/06/2010  . DM 01/06/2010  . Hyperlipidemia associated with type 2  diabetes mellitus (HCC) 01/06/2010  . FATIGUE 01/05/2010  . SHORTNESS OF BREATH 01/05/2010  . CHEST PAIN, LEFT 01/05/2010  . OBESITY, UNSPECIFIED 05/15/2009  . DEPRESSIVE DISORDER NOT ELSEWHERE CLASSIFIED 05/15/2009  . WEIGHT GAIN 05/15/2009  . TOBACCO USE, QUIT 05/15/2009    Past Surgical History:  Procedure Laterality Date  . CHOLECYSTECTOMY         Home Medications    Prior to Admission medications   Medication Sig Start Date End Date Taking? Authorizing Provider  AMBULATORY NON FORMULARY MEDICATION One touch ultra 2  Testing 2 twice a day. For Diabetes Mellitus type II, uncontrolled. 04/28/15   Breeback, Jade L, PA-C  atorvastatin (LIPITOR) 40 MG tablet Take 1 tablet (40 mg total) by mouth daily at 6 PM. 12/27/17   Breeback, Jade L, PA-C  cyclobenzaprine (FLEXERIL) 10 MG tablet Take 1 tablet (10 mg total) by mouth at bedtime. 04/18/18   Lattie Haw, MD  Dapagliflozin-metFORMIN HCl ER (XIGDUO XR) 04-999 MG TB24 Take 1 tablet by mouth daily. 12/27/17   Breeback, Lonna Cobb, PA-C  doxycycline (VIBRAMYCIN) 100 MG capsule Take 1 capsule (100 mg total) by mouth 2 (two) times daily. Take with food. 12/15/18   Lattie Haw, MD  Dulaglutide (TRULICITY) 1.5 MG/0.5ML SOPN Inject 1 application into the skin once a week. 12/27/17   Breeback, Jade L, PA-C  glipiZIDE (GLUCOTROL) 5 MG tablet Take 1 tablet (5 mg total)  by mouth 2 (two) times daily before a meal. 12/27/17   Breeback, Jade L, PA-C  glucose blood test strip 1 each by Other route as directed. Use as instructed     [provider]  HYDROcodone-acetaminophen (NORCO/VICODIN) 5-325 MG tablet Take 1 tablet by mouth every 6 (six) hours as needed for moderate pain or severe pain. 12/15/18   Lattie HawBeese, Nyella Eckels A, MD  Insulin Degludec (TRESIBA FLEXTOUCH) 200 UNIT/ML SOPN Inject 26 Units into the skin at bedtime. 12/27/17   Jomarie LongsBreeback, Jade L, PA-C  levothyroxine (SYNTHROID, LEVOTHROID) 100 MCG tablet Take 1 tablet (100 mcg total) by mouth daily  before breakfast. 12/27/17   Breeback, Jade L, PA-C  levothyroxine (SYNTHROID, LEVOTHROID) 100 MCG tablet Take 1 tablet (100 mcg total) by mouth daily before breakfast. Patient must make a follow-up with PCP for any future refills. 05/29/18   Breeback, Jade L, PA-C  lisinopril (PRINIVIL,ZESTRIL) 5 MG tablet Take 1 tablet (5 mg total) by mouth daily. 12/27/17   Breeback, Lonna CobbJade L, PA-C  meloxicam (MOBIC) 15 MG tablet Take 1 tablet (15 mg total) by mouth daily. 05/03/18   Lurene ShadowPhelps, Erin O, PA-C  methocarbamol (ROBAXIN) 500 MG tablet Take 1 tablet (500 mg total) by mouth 2 (two) times daily. 05/03/18   Lurene ShadowPhelps, Erin O, PA-C  Monolet Lancets MISC by Does not apply route as directed.      [provider]  predniSONE (DELTASONE) 20 MG tablet Take one tab by mouth twice daily for 4 days, then one daily for 3 days. Take with food. 04/18/18   Lattie HawBeese, Joselito Fieldhouse A, MD  sildenafil (VIAGRA) 100 MG tablet Take 1 tablet (100 mg total) daily as needed by mouth for erectile dysfunction. 06/03/17   Breeback, Lonna CobbJade L, PA-C  Vilazodone HCl (VIIBRYD) 20 MG TABS Take 1 tablet (20 mg total) by mouth daily. 12/27/17   Jomarie LongsBreeback, Jade L, PA-C    Family History Family History  Problem Relation Age of Onset  . Depression Mother   . Diabetes Mother   . Hypertension Mother   . Hyperlipidemia Mother   . Sleep apnea Maternal Uncle   . Alcohol abuse Maternal Uncle   . Diabetes Maternal Uncle   . Coronary artery disease Maternal Grandfather   . Heart attack Maternal Grandfather   . Alcohol abuse Paternal Grandfather   . Stroke Paternal Grandfather   . Diabetes Maternal Aunt   . Alcohol abuse Paternal Uncle   . Cancer Maternal Grandmother   . Diabetes Maternal Aunt     Social History Social History   Tobacco Use  . Smoking status: Former Games developermoker  . Smokeless tobacco: Never Used  . Tobacco comment: quit Jan 2010   Substance Use Topics  . Alcohol use: Yes    Comment: 3 etoh/week  . Drug use: No     Allergies   Patient  has no known allergies.   Review of Systems Review of Systems  Constitutional: Negative for fatigue and fever.  Gastrointestinal: Negative for nausea.  Neurological: Positive for headaches.  All other systems reviewed and are negative.    Physical Exam Triage Vital Signs ED Triage Vitals  Enc Vitals Group     BP 12/15/18 1254 (!) 138/92     Pulse Rate 12/15/18 1254 93     Resp 12/15/18 1254 18     Temp 12/15/18 1254 97.7 F (36.5 C)     Temp Source 12/15/18 1254 Oral     SpO2 12/15/18 1254 97 %  Weight --      Height --      Head Circumference --      Peak Flow --      Pain Score 12/15/18 1306 7     Pain Loc --      Pain Edu? --      Excl. in GC? --    No data found.  Updated Vital Signs BP (!) 138/92 (BP Location: Right Arm)   Pulse 93   Temp 97.7 F (36.5 C) (Oral)   Resp 18   SpO2 97%   Visual Acuity Right Eye Distance:   Left Eye Distance:   Bilateral Distance:    Right Eye Near:   Left Eye Near:    Bilateral Near:     Physical Exam Vitals signs and nursing note reviewed.  Constitutional:      General: He is not in acute distress. HENT:     Head: Normocephalic.     Right Ear: External ear normal.     Left Ear: External ear normal.     Nose: Nose normal.     Mouth/Throat:     Mouth: Mucous membranes are moist.  Eyes:     Pupils: Pupils are equal, round, and reactive to light.  Neck:     Musculoskeletal: Muscular tenderness present.      Comments: Posterior neck has a 2cm diameter fluctuant cyst with surrounding erythema and tenderness to palpation. Cardiovascular:     Rate and Rhythm: Normal rate.  Pulmonary:     Effort: Pulmonary effort is normal.  Lymphadenopathy:     Cervical: No cervical adenopathy.  Skin:    General: Skin is warm and dry.     Findings: No rash.  Neurological:     Mental Status: He is alert.      UC Treatments / Results  Labs (all labs ordered are listed, but only abnormal results are displayed) Labs  Reviewed - No data to display  EKG None  Radiology No results found.  Procedures Procedures  Incise and drain cyst/abscess Risks and benefits of procedure explained to patient and verbal consent obtained.  Using sterile technique and local anesthesia with 1% lidocaine with epinephrine, cleansed affected area with Betadine and saline. Identified the most fluctuant area of lesion and incised with #11 blade.  Expressed blood and purulent material.  Inserted Iodoform gauze packing.  Bandage applied.  Patient tolerated well   Medications Ordered in UC Medications  ibuprofen (ADVIL) tablet 600 mg (600 mg Oral Given 12/15/18 1340)    Initial Impression / Assessment and Plan / UC Course  I have reviewed the triage vital signs and the nursing notes.  Pertinent labs & imaging results that were available during my care of the patient were reviewed by me and considered in my medical decision making (see chart for details).    Wound culture pending.  Begin empiric doxycycline. Rx for Lortab (#10, no refill) Controlled Substance Prescriptions I have consulted the [place] Controlled Substances Registry for this patient, and feel the risk/benefit ratio today is favorable for proceeding with this prescription for a controlled substance.   Return for follow-up tomorrow.   Final Clinical Impressions(s) / UC Diagnoses   Final diagnoses:  Abscess of neck     Discharge Instructions     Keep bandage in place until follow-up visit tomorrow.  May take Ibuprofen 200mg , 4 tabs every 8 hours with food.     ED Prescriptions    Medication Sig Dispense Auth. Provider  doxycycline (VIBRAMYCIN) 100 MG capsule Take 1 capsule (100 mg total) by mouth 2 (two) times daily. Take with food. 14 capsule Lattie Haw, MD   HYDROcodone-acetaminophen (NORCO/VICODIN) 5-325 MG tablet Take 1 tablet by mouth every 6 (six) hours as needed for moderate pain or severe pain. 10 tablet Lattie Haw, MD        Lattie Haw, MD 12/18/18 [phone]

## 2018-12-15 NOTE — Discharge Instructions (Addendum)
Keep bandage in place until follow-up visit tomorrow.  May take Ibuprofen 200mg , 4 tabs every 8 hours with food.

## 2018-12-15 NOTE — ED Triage Notes (Signed)
Pt c/o cyst type bump on back on neck that is very painful. Started 2-3 days ago. Pain radiates down neck. Worried about infection.

## 2018-12-16 ENCOUNTER — Emergency Department (INDEPENDENT_AMBULATORY_CARE_PROVIDER_SITE_OTHER)
Admission: EM | Admit: 2018-12-16 | Discharge: 2018-12-16 | Disposition: A | Discharge status: Home / Self Care | Payer: BLUE CROSS/BLUE SHIELD | Source: Home / Self Care | Attending: Family Medicine | Admitting: Family Medicine

## 2018-12-16 ENCOUNTER — Other Ambulatory Visit: Payer: Self-pay

## 2018-12-16 DIAGNOSIS — Z5189 Encounter for other specified aftercare: Secondary | ICD-10-CM

## 2018-12-16 NOTE — Discharge Instructions (Addendum)
Finish antibiotic.  May begin warm soak or heating pad twice daily. Change bandage daily until healed.

## 2018-12-16 NOTE — ED Triage Notes (Addendum)
Pt here today to have wound checked and packing removed from back of neck where he had I&D yesterday. Says he has better movement in neck. Was able to get decent rest without an ice pack. Still a little sore. Has changed dressing twice and noticed a decent amount of more drainage.

## 2018-12-16 NOTE — ED Provider Notes (Signed)
[facility]    CSN: [id number] Arrival date & time: 12/16/18  1252     History   Chief Complaint Chief Complaint  Patient presents with  . Wound Check    f/u to I&D    HPI Austin Weeks is a 43 y.o. male.   Patient returns for wound check and removal of packing.  He reports that he has had a significant decrease in posterior neck pain.  The history is provided by the patient.    Past Medical History:  Diagnosis Date  . Depressed   . Diabetes mellitus without complication (HCC)   . Heart murmur    as a child  . Hypertension   . Hypothyroidism     Patient Active Problem List   Diagnosis Date Noted  . Right inguinal hernia 12/27/2017  . GAD (generalized anxiety disorder) 06/06/2017  . Depression, recurrent (HCC) 06/06/2017  . Erectile dysfunction due to diseases classified elsewhere 06/06/2017  . Poor dentition 05/04/2016  . Tooth pain 10/24/2015  . Depression 05/01/2015  . Generalized anxiety disorder 04/28/2015  . Type 2 diabetes mellitus with complication (HCC) 04/28/2015  . Other specified hypothyroidism 04/28/2015  . ESSENTIAL HYPERTENSION, BENIGN 01/27/2010  . Hypothyroidism 01/06/2010  . DM 01/06/2010  . Hyperlipidemia associated with type 2 diabetes mellitus (HCC) 01/06/2010  . FATIGUE 01/05/2010  . SHORTNESS OF BREATH 01/05/2010  . CHEST PAIN, LEFT 01/05/2010  . OBESITY, UNSPECIFIED 05/15/2009  . DEPRESSIVE DISORDER NOT ELSEWHERE CLASSIFIED 05/15/2009  . WEIGHT GAIN 05/15/2009  . TOBACCO USE, QUIT 05/15/2009    Past Surgical History:  Procedure Laterality Date  . CHOLECYSTECTOMY         Home Medications    Prior to Admission medications   Medication Sig Start Date End Date Taking? Authorizing Provider  AMBULATORY NON FORMULARY MEDICATION One touch ultra 2  Testing 2 twice a day. For Diabetes Mellitus type II, uncontrolled. 04/28/15   Breeback, Jade L, PA-C  atorvastatin (LIPITOR) 40 MG tablet Take 1 tablet (40 mg total) by  mouth daily at 6 PM. 12/27/17   Breeback, Jade L, PA-C  cyclobenzaprine (FLEXERIL) 10 MG tablet Take 1 tablet (10 mg total) by mouth at bedtime. 04/18/18   Lattie Haw, MD  Dapagliflozin-metFORMIN HCl ER (XIGDUO XR) 04-999 MG TB24 Take 1 tablet by mouth daily. 12/27/17   Breeback, Lonna Cobb, PA-C  doxycycline (VIBRAMYCIN) 100 MG capsule Take 1 capsule (100 mg total) by mouth 2 (two) times daily. Take with food. 12/15/18   Lattie Haw, MD  Dulaglutide (TRULICITY) 1.5 MG/0.5ML SOPN Inject 1 application into the skin once a week. 12/27/17   Breeback, Jade L, PA-C  glipiZIDE (GLUCOTROL) 5 MG tablet Take 1 tablet (5 mg total) by mouth 2 (two) times daily before a meal. 12/27/17   Breeback, Jade L, PA-C  glucose blood test strip 1 each by Other route as directed. Use as instructed     [provider]  HYDROcodone-acetaminophen (NORCO/VICODIN) 5-325 MG tablet Take 1 tablet by mouth every 6 (six) hours as needed for moderate pain or severe pain. 12/15/18   Lattie Haw, MD  Insulin Degludec (TRESIBA FLEXTOUCH) 200 UNIT/ML SOPN Inject 26 Units into the skin at bedtime. 12/27/17   Jomarie Longs, PA-C  levothyroxine (SYNTHROID, LEVOTHROID) 100 MCG tablet Take 1 tablet (100 mcg total) by mouth daily before breakfast. 12/27/17   Breeback, Jade L, PA-C  levothyroxine (SYNTHROID, LEVOTHROID) 100 MCG tablet Take 1 tablet (100 mcg total) by mouth daily  before breakfast. Patient must make a follow-up with PCP for any future refills. 05/29/18   Breeback, Jade L, PA-C  lisinopril (PRINIVIL,ZESTRIL) 5 MG tablet Take 1 tablet (5 mg total) by mouth daily. 12/27/17   Breeback, Lonna CobbJade L, PA-C  meloxicam (MOBIC) 15 MG tablet Take 1 tablet (15 mg total) by mouth daily. 05/03/18   Lurene ShadowPhelps, Erin O, PA-C  methocarbamol (ROBAXIN) 500 MG tablet Take 1 tablet (500 mg total) by mouth 2 (two) times daily. 05/03/18   Lurene ShadowPhelps, Erin O, PA-C  [place] Lancets MISC by Does not apply route as directed.      [provider]   predniSONE (DELTASONE) 20 MG tablet Take one tab by mouth twice daily for 4 days, then one daily for 3 days. Take with food. 04/18/18   Lattie HawBeese, Jeramyah Goodpasture A, MD  sildenafil (VIAGRA) 100 MG tablet Take 1 tablet (100 mg total) daily as needed by mouth for erectile dysfunction. 06/03/17   Breeback, Lonna CobbJade L, PA-C  Vilazodone HCl (VIIBRYD) 20 MG TABS Take 1 tablet (20 mg total) by mouth daily. 12/27/17   Jomarie LongsBreeback, Jade L, PA-C    Family History Family History  Problem Relation Age of Onset  . Depression Mother   . Diabetes Mother   . Hypertension Mother   . Hyperlipidemia Mother   . Sleep apnea Maternal Uncle   . Alcohol abuse Maternal Uncle   . Diabetes Maternal Uncle   . Coronary artery disease Maternal Grandfather   . Heart attack Maternal Grandfather   . Alcohol abuse Paternal Grandfather   . Stroke Paternal Grandfather   . Diabetes Maternal Aunt   . Alcohol abuse Paternal Uncle   . Cancer Maternal Grandmother   . Diabetes Maternal Aunt     Social History Social History   Tobacco Use  . Smoking status: Former Games developermoker  . Smokeless tobacco: Never Used  . Tobacco comment: quit Jan 2010   Substance Use Topics  . Alcohol use: Yes    Comment: 3 etoh/week  . Drug use: No     Allergies   Patient has no known allergies.   Review of Systems Review of Systems  Constitutional: Negative for appetite change, chills, diaphoresis, fatigue and fever.  Musculoskeletal: Negative for neck pain.     Physical Exam Triage Vital Signs ED Triage Vitals  Enc Vitals Group     BP 12/16/18 1306 (!) 137/97     Pulse Rate 12/16/18 1306 97     Resp 12/16/18 1306 18     Temp 12/16/18 1306 97.8 F (36.6 C)     Temp Source 12/16/18 1306 Oral     SpO2 12/16/18 1306 96 %     Weight 12/16/18 1309 242 lb (109.8 kg)     Height 12/16/18 1309 6\' 1"  (1.854 m)     Head Circumference --      Peak Flow --      Pain Score 12/16/18 1306 0     Pain Loc --      Pain Edu? --      Excl. in GC? --    No data  found.  Updated Vital Signs BP (!) 137/97 (BP Location: Right Arm)   Pulse 97   Temp 97.8 F (36.6 C) (Oral)   Resp 18   Ht 6\' 1"  (1.854 m)   Wt 109.8 kg   SpO2 96%   BMI 31.93 kg/m   Visual Acuity Right Eye Distance:   Left Eye Distance:   Bilateral Distance:  Right Eye Near:   Left Eye Near:    Bilateral Near:     Physical Exam Nursing notes and Vital Signs reviewed. Appearance:  Patient appears stated age, and in no acute distress Posterior neck:  Minimal tenderness and erythema at I and D site.  Removed packing:  Wound shallow and minimal drainage present.  No further packing indicated.  Bandage applied.  UC Treatments / Results  Labs (all labs ordered are listed, but only abnormal results are displayed) Labs Reviewed - No data to display  EKG None  Radiology No results found.  Procedures Procedures (including critical care time)  Medications Ordered in UC Medications - No data to display  Initial Impression / Assessment and Plan / UC Course  I have reviewed the triage vital signs and the nursing notes.  Pertinent labs & imaging results that were available during my care of the patient were reviewed by me and considered in my medical decision making (see chart for details).    Abscess healing well. Return for worsening symptoms.   Final Clinical Impressions(s) / UC Diagnoses   Final diagnoses:  Visit for wound check     Discharge Instructions     Finish antibiotic.  May begin warm soak or heating pad twice daily. Change bandage daily until healed.      ED Prescriptions    None        Lattie Haw, MD 12/18/18 1324

## 2018-12-19 ENCOUNTER — Telehealth: Payer: Self-pay

## 2018-12-19 LAB — WOUND CULTURE
MICRO NUMBER:: 503759
SPECIMEN QUALITY:: ADEQUATE

## 2018-12-19 NOTE — Telephone Encounter (Signed)
Left message with wcx results and contact information for any questions or concerns.

## 2019-03-26 ENCOUNTER — Encounter: Payer: Self-pay | Admitting: Physician Assistant

## 2019-03-26 ENCOUNTER — Ambulatory Visit (INDEPENDENT_AMBULATORY_CARE_PROVIDER_SITE_OTHER): Payer: BC Managed Care – PPO | Admitting: Physician Assistant

## 2019-03-26 ENCOUNTER — Other Ambulatory Visit: Payer: Self-pay

## 2019-03-26 VITALS — BP 155/106 | HR 93 | Ht 73.0 in | Wt 247.0 lb

## 2019-03-26 DIAGNOSIS — F411 Generalized anxiety disorder: Secondary | ICD-10-CM

## 2019-03-26 DIAGNOSIS — F339 Major depressive disorder, recurrent, unspecified: Secondary | ICD-10-CM

## 2019-03-26 DIAGNOSIS — E118 Type 2 diabetes mellitus with unspecified complications: Secondary | ICD-10-CM

## 2019-03-26 DIAGNOSIS — Z794 Long term (current) use of insulin: Secondary | ICD-10-CM | POA: Diagnosis not present

## 2019-03-26 DIAGNOSIS — E785 Hyperlipidemia, unspecified: Secondary | ICD-10-CM

## 2019-03-26 DIAGNOSIS — I10 Essential (primary) hypertension: Secondary | ICD-10-CM

## 2019-03-26 DIAGNOSIS — R0789 Other chest pain: Secondary | ICD-10-CM

## 2019-03-26 DIAGNOSIS — E1169 Type 2 diabetes mellitus with other specified complication: Secondary | ICD-10-CM

## 2019-03-26 DIAGNOSIS — F439 Reaction to severe stress, unspecified: Secondary | ICD-10-CM

## 2019-03-26 DIAGNOSIS — Z23 Encounter for immunization: Secondary | ICD-10-CM | POA: Diagnosis not present

## 2019-03-26 DIAGNOSIS — N521 Erectile dysfunction due to diseases classified elsewhere: Secondary | ICD-10-CM

## 2019-03-26 DIAGNOSIS — E039 Hypothyroidism, unspecified: Secondary | ICD-10-CM

## 2019-03-26 LAB — POCT GLYCOSYLATED HEMOGLOBIN (HGB A1C): Hemoglobin A1C: 14 % — AB (ref 4.0–5.6)

## 2019-03-26 MED ORDER — XIGDUO XR 10-1000 MG PO TB24: 1.0000 | ORAL_TABLET | Freq: Every day | ORAL | 0 refills | Status: DC

## 2019-03-26 MED ORDER — OZEMPIC (0.25 OR 0.5 MG/DOSE) 2 MG/1.5ML ~~LOC~~ SOPN: 0.5000 mg | PEN_INJECTOR | SUBCUTANEOUS | 2 refills | Status: DC

## 2019-03-26 MED ORDER — ATORVASTATIN CALCIUM 40 MG PO TABS: 40.0000 mg | ORAL_TABLET | Freq: Every day | ORAL | 1 refills | Status: DC

## 2019-03-26 MED ORDER — VIIBRYD 20 MG PO TABS: 1.0000 | ORAL_TABLET | Freq: Every day | ORAL | 2 refills | Status: DC

## 2019-03-26 MED ORDER — AMBULATORY NON FORMULARY MEDICATION: 5 refills | Status: AC

## 2019-03-26 MED ORDER — SILDENAFIL CITRATE 100 MG PO TABS: 100.0000 mg | ORAL_TABLET | Freq: Every day | ORAL | 2 refills | Status: DC | PRN

## 2019-03-26 MED ORDER — GLIPIZIDE 5 MG PO TABS: 5.0000 mg | ORAL_TABLET | Freq: Two times a day (BID) | ORAL | 0 refills | Status: DC

## 2019-03-26 MED ORDER — LISINOPRIL 10 MG PO TABS: 10.0000 mg | ORAL_TABLET | Freq: Every day | ORAL | 1 refills | Status: DC

## 2019-03-26 MED ORDER — LEVOTHYROXINE SODIUM 100 MCG PO TABS: 100.0000 ug | ORAL_TABLET | Freq: Every day | ORAL | 2 refills | Status: DC

## 2019-03-26 MED ORDER — TRESIBA FLEXTOUCH 200 UNIT/ML ~~LOC~~ SOPN: 25.0000 [IU] | PEN_INJECTOR | Freq: Every day | SUBCUTANEOUS | 2 refills | Status: DC

## 2019-03-26 NOTE — Patient Instructions (Addendum)
RESTART meds. BP follow up in 1 week.

## 2019-03-26 NOTE — Progress Notes (Signed)
Subjective:    Patient ID: Austin Weeks, male    DOB: 08-30-1975, 43 y.o.   MRN: [id number]  HPI  Pt is a 43 yo male with T2DM, HLD, MDD, HTN, Anxiety, hypothyroidism who comes in for medication refills.   Pt admits she has not been compliant with his medications. He has not been seen in almost a year or had medications refilled. He feels "desperate" now. His wife lost her job. He fills terrible all the time. He is stressed and tired. He started having some intermittent chest tightness and pain randomly and now "scared he is going to have a heart attack and die". Denies any CP or SOB with exertion. Pt is not checking sugars and now watching his sugars and carbs.   .. Active Ambulatory Problems    Diagnosis Date Noted  . Hypothyroidism 01/06/2010  . DM 01/06/2010  . Hyperlipidemia associated with type 2 diabetes mellitus (HCC) 01/06/2010  . OBESITY, UNSPECIFIED 05/15/2009  . DEPRESSIVE DISORDER NOT ELSEWHERE CLASSIFIED 05/15/2009  . Essential hypertension 01/27/2010  . FATIGUE 01/05/2010  . WEIGHT GAIN 05/15/2009  . SHORTNESS OF BREATH 01/05/2010  . CHEST PAIN, LEFT 01/05/2010  . TOBACCO USE, QUIT 05/15/2009  . Generalized anxiety disorder 04/28/2015  . Type 2 diabetes mellitus with complication (HCC) 04/28/2015  . Other specified hypothyroidism 04/28/2015  . Depression 05/01/2015  . Tooth pain 10/24/2015  . Poor dentition 05/04/2016  . GAD (generalized anxiety disorder) 06/06/2017  . Depression, recurrent (HCC) 06/06/2017  . Erectile dysfunction due to diseases classified elsewhere 06/06/2017  . Right inguinal hernia 12/27/2017  . Stress at home 03/29/2019   Resolved Ambulatory Problems    Diagnosis Date Noted  . No Resolved Ambulatory Problems   Past Medical History:  Diagnosis Date  . Depressed   . Diabetes mellitus without complication (HCC)   . Heart murmur   . Hypertension         Review of Systems    see HPI.  Objective:   Physical Exam Vitals signs  reviewed.  Constitutional:      Appearance: Normal appearance. He is obese.     Comments: Pale and dark under eyes.   HENT:     Head: Normocephalic.  Neck:     Vascular: No carotid bruit.  Cardiovascular:     Rate and Rhythm: Normal rate and regular rhythm.     Pulses: Normal pulses.  Pulmonary:     Effort: Pulmonary effort is normal.  Neurological:     General: No focal deficit present.     Mental Status: He is alert and oriented to person, place, and time.  Psychiatric:        Behavior: Behavior normal.     Comments: Flat affect.            Assessment & Plan:  Marland KitchenMarland KitchenSami was seen today for diabetes, depression, anxiety and stress.  Diagnoses and all orders for this visit:  Type 2 diabetes mellitus with complication, with long-term current use of insulin (HCC) -     POCT glycosylated hemoglobin (Hb A1C) -     Dapagliflozin-metFORMIN HCl ER (XIGDUO XR) 04-999 MG TB24; Take 1 tablet by mouth daily. -     glipiZIDE (GLUCOTROL) 5 MG tablet; Take 1 tablet (5 mg total) by mouth 2 (two) times daily before a meal. -     Semaglutide,0.25 or 0.5MG /DOS, (OZEMPIC, 0.25 OR 0.5 MG/DOSE,) 2 MG/1.5ML SOPN; Inject 0.5 mg into the skin once a week. -  Insulin Degludec (TRESIBA FLEXTOUCH) 200 UNIT/ML SOPN; Inject 26 Units into the skin at bedtime. -     lisinopril (ZESTRIL) 10 MG tablet; Take 1 tablet (10 mg total) by mouth daily.  Hypothyroidism, unspecified type -     levothyroxine (SYNTHROID) 100 MCG tablet; Take 1 tablet (100 mcg total) by mouth daily before breakfast.  Depression, recurrent (HCC) -     Vilazodone HCl (VIIBRYD) 20 MG TABS; Take 1 tablet (20 mg total) by mouth daily.  GAD (generalized anxiety disorder) -     Vilazodone HCl (VIIBRYD) 20 MG TABS; Take 1 tablet (20 mg total) by mouth daily.  Hyperlipidemia associated with type 2 diabetes mellitus (HCC) -     atorvastatin (LIPITOR) 40 MG tablet; Take 1 tablet (40 mg total) by mouth daily at 6 PM.  Other chest pain -      EKG 12-Lead  Flu vaccine need -     Flu Vaccine QUAD 36+ mos IM  Erectile dysfunction due to diseases classified elsewhere -     sildenafil (VIAGRA) 100 MG tablet; Take 1 tablet (100 mg total) by mouth daily as needed for erectile dysfunction.  Stress at home  Essential hypertension -     lisinopril (ZESTRIL) 10 MG tablet; Take 1 tablet (10 mg total) by mouth daily.  Other orders -     AMBULATORY NON FORMULARY MEDICATION; One touch ultra 2  Testing 2 twice a day. For Diabetes Mellitus type II, uncontrolled.  .. Depression screen [facility] 2/9 03/26/2019 12/27/2017 06/03/2017  Decreased Interest 3 3 2   Down, Depressed, Hopeless 3 0 2  PHQ - 2 Score 6 3 4   Altered sleeping 3 0 1  Tired, decreased energy 2 3 1   Change in appetite 3 0 0  Feeling bad or failure about yourself  2 1 0  Trouble concentrating 3 0 0  Moving slowly or fidgety/restless 0 0 2  Suicidal thoughts 2 0 0  PHQ-9 Score 21 7 8   Difficult doing work/chores Extremely dIfficult Somewhat difficult Very difficult   .Marland Kitchen GAD 7 : Generalized Anxiety Score 03/26/2019 12/27/2017 06/03/2017  Nervous, Anxious, on Edge 3 2 2   Control/stop worrying 3 1 1   Worry too much - different things 3 2 2   Trouble relaxing 2 0 1  Restless 1 0 1  Easily annoyed or irritable 3 3 3   Afraid - awful might happen 3 0 2  Total GAD 7 Score 18 8 12   Anxiety Difficulty Extremely difficult Somewhat difficult -    EKG-normal sinus rhythm, no signs of arrhythmias, no signs of ST depression or elevation, there was a nonspecific right bundle branch block noted.  Lab Results  Component Value Date   HGBA1C 14.0 (A) 40/98/1191   It certainly no wonder patient feels terrible.  He has not been on any medications.  His blood pressure is out of control, his sugars are out of control, likely his thyroid is out of control.  His PHQ and GAD scores are terrible.  He has a lot going on at home.  Had a long discussion with patient that until he gets his health  under control he likely will not get his mood under control either.  Discussed he must be compliant with medications and come back for regular visits in order for [place] to slowly make a difference and get him to where he needs to be.  Currently he feels like he cannot keep up with work.  I agree with this.  His sugars  are terrible.  I will write him out of work for the next 4 weeks while he we work on medication compliance.  I do want a blood pressure recheck in 1 week.  Restarted all medications and went over dosages with patient.  Gave coupon card for all branded drugs.  No need to check fasting labs today as they will be terrible because he has not been on medication.  When he comes back in 3 months we will check labs.  Marland Kitchen..Spent 30 minutes with patient and greater than 50 percent of visit spent counseling patient regarding treatment plan.

## 2019-03-27 ENCOUNTER — Other Ambulatory Visit: Payer: Self-pay | Admitting: Neurology

## 2019-03-27 MED ORDER — ACCU-CHEK AVIVA DEVI: 0 refills | Status: AC

## 2019-03-27 NOTE — Telephone Encounter (Signed)
Received note from [facility] that one touch strips not covered by insurance. Needs RX for Accu Chek meter, lancets, and strips. RX sent.

## 2019-03-29 ENCOUNTER — Encounter: Payer: Self-pay | Admitting: Physician Assistant

## 2019-03-29 DIAGNOSIS — F439 Reaction to severe stress, unspecified: Secondary | ICD-10-CM | POA: Insufficient documentation

## 2019-04-10 ENCOUNTER — Encounter: Payer: Self-pay | Admitting: Physician Assistant

## 2019-04-11 ENCOUNTER — Encounter: Payer: Self-pay | Admitting: Physician Assistant

## 2019-04-13 ENCOUNTER — Encounter: Payer: Self-pay | Admitting: Neurology

## 2019-04-13 ENCOUNTER — Encounter: Payer: Self-pay | Admitting: Physician Assistant

## 2019-04-13 NOTE — Telephone Encounter (Signed)
These haven't been given to me as completed yet.

## 2019-04-16 NOTE — Telephone Encounter (Signed)
Copies made. Left at front for pick up and sent to scanning. Paperwork faxed to [place] at [phone] with confirmation received.

## 2019-04-20 ENCOUNTER — Other Ambulatory Visit: Payer: Self-pay | Admitting: Physician Assistant

## 2019-04-20 DIAGNOSIS — F339 Major depressive disorder, recurrent, unspecified: Secondary | ICD-10-CM

## 2019-04-20 DIAGNOSIS — F411 Generalized anxiety disorder: Secondary | ICD-10-CM

## 2019-04-23 ENCOUNTER — Encounter: Payer: Self-pay | Admitting: Physician Assistant

## 2019-04-23 ENCOUNTER — Ambulatory Visit (INDEPENDENT_AMBULATORY_CARE_PROVIDER_SITE_OTHER): Payer: BC Managed Care – PPO | Admitting: Physician Assistant

## 2019-04-23 ENCOUNTER — Other Ambulatory Visit: Payer: Self-pay

## 2019-04-23 VITALS — BP 143/80 | HR 92 | Ht 73.0 in | Wt 238.0 lb

## 2019-04-23 DIAGNOSIS — M549 Dorsalgia, unspecified: Secondary | ICD-10-CM | POA: Diagnosis not present

## 2019-04-23 DIAGNOSIS — I10 Essential (primary) hypertension: Secondary | ICD-10-CM | POA: Diagnosis not present

## 2019-04-23 DIAGNOSIS — Z8719 Personal history of other diseases of the digestive system: Secondary | ICD-10-CM

## 2019-04-23 DIAGNOSIS — R1013 Epigastric pain: Secondary | ICD-10-CM | POA: Diagnosis not present

## 2019-04-23 DIAGNOSIS — E118 Type 2 diabetes mellitus with unspecified complications: Secondary | ICD-10-CM | POA: Diagnosis not present

## 2019-04-23 MED ORDER — FAMOTIDINE 20 MG PO TABS: 20.0000 mg | ORAL_TABLET | Freq: Two times a day (BID) | ORAL | 0 refills | Status: DC

## 2019-04-23 MED ORDER — LISINOPRIL 20 MG PO TABS: 20.0000 mg | ORAL_TABLET | Freq: Every day | ORAL | 0 refills | Status: DC

## 2019-04-23 NOTE — Progress Notes (Signed)
Subjective:    Patient ID: Austin Weeks, Austin Weeks    DOB: 1976/01/03, 43 y.o.   Austin Weeks  HPI  Pt is a 43 y/o Austin Weeks with HTN, T2DM, depression, hypothyroidism presents to the clinic for 4 week follow up. At last visit he started ozempic, to which he had GI upset with diarrhea and nausea. He reached out via myChart a few weeks ago and was instructed to decrease the dose, which he stated made him constipated. He took OTC laxatives to relieve this. He has been having epigastric pain that radiates to his back. The pain comes and goes; he denies it being associated with any foods and does not know when an "attack" will come. He has been worried that all of this is due to the ozempic, so he stopped taking it a week ago.   He has been feeling better in general - states he is staying away from sugars and carbs, but admits to having a harder time keeping track of carbs. States that he is unable to exercise because he has a hernia, but does stay active by walking and through ADLs. Denies any hypoglycemic associated symptoms. Checks his sugar daily, stating they have been consistently around 160. He is taking tresiba, xigduo, and glipizide.   Pt was written out of work to get back on track. He is ready to return.   .. Active Ambulatory Problems    Diagnosis Date Noted  . Hypothyroidism 01/06/2010  . DM 01/06/2010  . Hyperlipidemia associated with type 2 diabetes mellitus ([place]) 01/06/2010  . OBESITY, UNSPECIFIED 05/15/2009  . DEPRESSIVE DISORDER NOT ELSEWHERE CLASSIFIED 05/15/2009  . Essential hypertension 01/27/2010  . FATIGUE 01/05/2010  . WEIGHT GAIN 05/15/2009  . SHORTNESS OF BREATH 01/05/2010  . CHEST PAIN, LEFT 01/05/2010  . TOBACCO USE, QUIT 05/15/2009  . Generalized anxiety disorder 04/28/2015  . Type 2 diabetes mellitus with complication ([place]) 93/81/0175  . Other specified hypothyroidism 04/28/2015  . Depression 05/01/2015  . Tooth pain 10/24/2015  . Poor dentition 05/04/2016  . GAD  (generalized anxiety disorder) 06/06/2017  . Depression, recurrent ([place]) 06/06/2017  . Erectile dysfunction due to diseases classified elsewhere 06/06/2017  . Right inguinal hernia 12/27/2017  . Stress at home 03/29/2019  . Eosinophils increased 09/Austin/2020  . Acute recurrent pancreatitis 09/Austin/2020   Resolved Ambulatory Problems    Diagnosis Date Noted  . No Resolved Ambulatory Problems   Past Medical History:  Diagnosis Date  . Depressed   . Diabetes mellitus without complication ([place])   . Heart murmur   . Hypertension       Review of Systems  Eyes:       Admits to vision changes, some blurry vision. Will be seeing his eye doctor in the next couple of weeks.   Respiratory: Negative for chest tightness and shortness of breath.   Cardiovascular: Negative for chest pain and palpitations.  Gastrointestinal: Positive for abdominal pain (epigastric).       See HPI.    Endocrine: Negative for polydipsia.  Skin:       Denies skin breakdown, ulcer formation.    Neurological: Negative for dizziness, weakness, light-headedness, numbness and headaches.      Objective:   Physical Exam Constitutional:      Appearance: Normal appearance.  HENT:     Head: Normocephalic and atraumatic.  Eyes:     Extraocular Movements: Extraocular movements intact.     Conjunctiva/sclera: Conjunctivae normal.     Pupils: Pupils are equal, round, and  reactive to light.  Cardiovascular:     Rate and Rhythm: Normal rate and regular rhythm.  Pulmonary:     Effort: Pulmonary effort is normal.     Breath sounds: Normal breath sounds.  Abdominal:     General: There is no distension.     Palpations: Abdomen is soft.     Tenderness: There is no abdominal tenderness.  Neurological:     Mental Status: He is alert.    Today's Vitals   04/23/19 1515  BP: (!) 143/80  Pulse: (!) 110  SpO2: 100%  Weight: 238 lb (108 kg)  Height: 6\' 1"  (1.854 m)   Body mass index is 31.4 kg/m.  Manual RUE:  142/82 mmHg      Assessment & Plan:   Marland KitchenNed was seen today for follow-up, diabetes and stress.  Diagnoses and all orders for this visit:  Epigastric pain -     Lipase -     COMPLETE METABOLIC PANEL WITH GFR -     CBC with Differential/Platelet -     famotidine (PEPCID) 20 MG tablet; Take 1 tablet (20 mg total) by mouth 2 (two) times daily.  Essential hypertension -     lisinopril (ZESTRIL) 20 MG tablet; Take 1 tablet (20 mg total) by mouth daily.  Type 2 diabetes mellitus with complication (HCC)  Pain radiating to back -     Lipase -     COMPLETE METABOLIC PANEL WITH GFR -     CBC with Differential/Platelet  History of pancreatitis -     Lipase -     COMPLETE METABOLIC PANEL WITH GFR -     CBC with Differential/Platelet   Not due for A1C at this time. Follow up in 2 months.   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  Will get labs.   Instructed pt to take pepcid during the acute attacks for relief.   BP elevated in office - increased dose of lisinopril to 20mg .  Will continue to monitor.   Continue taking insulin tresiba, glipizide, and dapaglifozin/metformin.   On statin.  On ACE.   Doing much better. RTW on 04/26/19.   06/26/19 PA-C, have reviewed and agree with the above documentation in it's entirety.

## 2019-04-24 ENCOUNTER — Encounter: Payer: Self-pay | Admitting: Physician Assistant

## 2019-04-24 DIAGNOSIS — K859 Acute pancreatitis without necrosis or infection, unspecified: Secondary | ICD-10-CM | POA: Insufficient documentation

## 2019-04-24 DIAGNOSIS — R898 Other abnormal findings in specimens from other organs, systems and tissues: Secondary | ICD-10-CM | POA: Insufficient documentation

## 2019-04-24 LAB — COMPLETE METABOLIC PANEL WITH GFR
AG Ratio: 1.8 (calc) (ref 1.0–2.5)
ALT: 83 U/L — ABNORMAL HIGH (ref 9–46)
AST: 51 U/L — ABNORMAL HIGH (ref 10–40)
Albumin: 5.1 g/dL (ref 3.6–5.1)
Alkaline phosphatase (APISO): 110 U/L (ref 36–130)
BUN: 13 mg/dL (ref 7–25)
CO2: 26 mmol/L (ref 20–32)
Calcium: 10.4 mg/dL — ABNORMAL HIGH (ref 8.6–10.3)
Chloride: 104 mmol/L (ref 98–110)
Creat: 1.26 mg/dL (ref 0.60–1.35)
GFR, Est African American: 82 mL/min/{1.73_m2} (ref >=60)
GFR, Est Non African American: 71 mL/min/{1.73_m2} (ref >=60)
Globulin: 2.8 g/dL (calc) (ref 1.9–3.7)
Glucose, Bld: 109 mg/dL — ABNORMAL HIGH (ref 65–99)
Potassium: 5.1 mmol/L (ref 3.5–5.3)
Sodium: 140 mmol/L (ref 135–146)
Total Bilirubin: 0.7 mg/dL (ref 0.2–1.2)
Total Protein: 7.9 g/dL (ref 6.1–8.1)

## 2019-04-24 LAB — CBC WITH DIFFERENTIAL/PLATELET
Absolute Monocytes: 448 cells/uL (ref 200–950)
Basophils Absolute: 133 cells/uL (ref 0–200)
Basophils Relative: 1.6 %
Eosinophils Absolute: 963 cells/uL — ABNORMAL HIGH (ref 15–500)
Eosinophils Relative: 11.6 %
HCT: 50 % (ref 38.5–50.0)
Hemoglobin: 17.1 g/dL (ref 13.2–17.1)
Lymphs Abs: 1743 cells/uL (ref 850–3900)
MCH: 29.2 pg (ref 27.0–33.0)
MCHC: 34.2 g/dL (ref 32.0–36.0)
MCV: 85.5 fL (ref 80.0–100.0)
MPV: 11.1 fL (ref 7.5–12.5)
Monocytes Relative: 5.4 %
Neutro Abs: 5013 cells/uL (ref 1500–7800)
Neutrophils Relative %: 60.4 %
Platelets: 263 10*3/uL (ref 140–400)
RBC: 5.85 10*6/uL — ABNORMAL HIGH (ref 4.20–5.80)
RDW: 12.6 % (ref 11.0–15.0)
Total Lymphocyte: 21 %
WBC: 8.3 10*3/uL (ref 3.8–10.8)

## 2019-04-24 LAB — LIPASE: Lipase: 90 U/L — ABNORMAL HIGH (ref 7–60)

## 2019-04-24 NOTE — Progress Notes (Signed)
Austin Weeks,   Way to go your random sugar was 109!!!!!  Your calcium is up a little. Make sure taking vitamin d 1000 units daily.   Your liver enzymes and lipase were up. I do think ozempic caused you some pancreatitis and body is still healing. Keep a bland diet for the next week or so. AVoid ANY alcohol. Limit any tylenol use. Let me know if symptoms worsen. If continue to improve then just recheck labs in 2 weeks(lipase/hepatic function panel, CBC).   Your eosinophils are also elevated. Those are usually elevated in times of allergies. Are you taking anything for allergies? Are you having allergy symptoms? Since never been elevated honestly could have come for ozempic as well.   -Luvenia Starch

## 2019-05-07 ENCOUNTER — Other Ambulatory Visit: Payer: Self-pay | Admitting: Physician Assistant

## 2019-05-07 DIAGNOSIS — R1013 Epigastric pain: Secondary | ICD-10-CM

## 2019-05-23 ENCOUNTER — Ambulatory Visit (INDEPENDENT_AMBULATORY_CARE_PROVIDER_SITE_OTHER): Payer: BC Managed Care – PPO | Admitting: Physician Assistant

## 2019-05-23 ENCOUNTER — Other Ambulatory Visit: Payer: Self-pay

## 2019-05-23 ENCOUNTER — Encounter: Payer: Self-pay | Admitting: Physician Assistant

## 2019-05-23 VITALS — BP 122/75 | HR 82 | Ht 73.0 in | Wt 241.0 lb

## 2019-05-23 DIAGNOSIS — F411 Generalized anxiety disorder: Secondary | ICD-10-CM

## 2019-05-23 DIAGNOSIS — F339 Major depressive disorder, recurrent, unspecified: Secondary | ICD-10-CM

## 2019-05-23 DIAGNOSIS — I10 Essential (primary) hypertension: Secondary | ICD-10-CM | POA: Diagnosis not present

## 2019-05-23 DIAGNOSIS — E039 Hypothyroidism, unspecified: Secondary | ICD-10-CM | POA: Diagnosis not present

## 2019-05-23 DIAGNOSIS — E1169 Type 2 diabetes mellitus with other specified complication: Secondary | ICD-10-CM

## 2019-05-23 DIAGNOSIS — E785 Hyperlipidemia, unspecified: Secondary | ICD-10-CM

## 2019-05-23 DIAGNOSIS — E1165 Type 2 diabetes mellitus with hyperglycemia: Secondary | ICD-10-CM | POA: Diagnosis not present

## 2019-05-23 DIAGNOSIS — Z794 Long term (current) use of insulin: Secondary | ICD-10-CM

## 2019-05-23 DIAGNOSIS — E118 Type 2 diabetes mellitus with unspecified complications: Secondary | ICD-10-CM

## 2019-05-23 NOTE — Progress Notes (Signed)
Subjective:    Patient ID: Austin Weeks, male    DOB: 19-Jul-1976, 43 y.o.   MRN: [id number]  HPI  Pt is a 43 yo male with T2DM, HTN, hypothyroidism, HLD, MDD, GAD who presents to the clinic for follow up.   In the past he has not been compliant with medications and sugars have not been controlled. He has decided to work on his overall health. He is doing much better. He feels much better. He is taking his medication. His mood is much better. He is checking morning sugars and running 130s to 150s. He denies any open sores or wounds. No hypoglycemic events. Taking xigduo/glipizide/tresbia. He denies any CP, palpitations, headaches or vision changes.   .. Active Ambulatory Problems    Diagnosis Date Noted  . Hypothyroidism 01/06/2010  . DM 01/06/2010  . Hyperlipidemia associated with type 2 diabetes mellitus ([place]) 01/06/2010  . OBESITY, UNSPECIFIED 05/15/2009  . DEPRESSIVE DISORDER NOT ELSEWHERE CLASSIFIED 05/15/2009  . Essential hypertension 01/27/2010  . FATIGUE 01/05/2010  . WEIGHT GAIN 05/15/2009  . SHORTNESS OF BREATH 01/05/2010  . CHEST PAIN, LEFT 01/05/2010  . TOBACCO USE, QUIT 05/15/2009  . Generalized anxiety disorder 04/28/2015  . Type 2 diabetes mellitus with complication ([place]) 09/98/3382  . Other specified hypothyroidism 04/28/2015  . Depression 05/01/2015  . Tooth pain 10/24/2015  . Poor dentition 05/04/2016  . GAD (generalized anxiety disorder) 06/06/2017  . Depression, recurrent ([place]) 06/06/2017  . Erectile dysfunction due to diseases classified elsewhere 06/06/2017  . Right inguinal hernia 12/27/2017  . Stress at home 03/29/2019  . Eosinophils increased 04/24/2019  . Acute recurrent pancreatitis 04/24/2019   Resolved Ambulatory Problems    Diagnosis Date Noted  . No Resolved Ambulatory Problems   Past Medical History:  Diagnosis Date  . Depressed   . Diabetes mellitus without complication ([place])   . Heart murmur   . Hypertension       Review of  Systems  All other systems reviewed and are negative.      Objective:   Physical Exam Vitals signs reviewed.  Constitutional:      Appearance: Normal appearance.  Cardiovascular:     Rate and Rhythm: Normal rate and regular rhythm.     Pulses: Normal pulses.  Pulmonary:     Effort: Pulmonary effort is normal.     Breath sounds: Normal breath sounds.  Neurological:     General: No focal deficit present.     Mental Status: He is alert and oriented to person, place, and time.  Psychiatric:        Mood and Affect: Mood normal.        Behavior: Behavior normal.       .. Depression screen [facility] 2/9 04/23/2019 03/26/2019 12/27/2017 06/03/2017  Decreased Interest 2 3 3 2   Down, Depressed, Hopeless 2 3 0 2  PHQ - 2 Score 4 6 3 4   Altered sleeping 0 3 0 1  Tired, decreased energy 2 2 3 1   Change in appetite 1 3 0 0  Feeling bad or failure about yourself  2 2 1  0  Trouble concentrating 2 3 0 0  Moving slowly or fidgety/restless 1 0 0 2  Suicidal thoughts 1 2 0 0  PHQ-9 Score 13 21 7 8   Difficult doing work/chores Very difficult Extremely dIfficult Somewhat difficult Very difficult   .Marland Kitchen GAD 7 : Generalized Anxiety Score 04/23/2019 03/26/2019 12/27/2017 06/03/2017  Nervous, Anxious, on Edge 2 3 2 2   Control/stop worrying  2 3 1 1   Worry too much - different things 2 3 2 2   Trouble relaxing 1 2 0 1  Restless 1 1 0 1  Easily annoyed or irritable 3 3 3 3   Afraid - awful might happen 2 3 0 2  Total GAD 7 Score 13 18 8 12   Anxiety Difficulty Very difficult Extremely difficult Somewhat difficult -        Assessment & Plan:   Austin Weeks was seen today for follow-up.  Diagnoses and all orders for this visit:  Type 2 diabetes mellitus with hyperglycemia, with long-term current use of insulin (HCC) -     Hemoglobin A1c  Acquired hypothyroidism -     TSH  Essential hypertension  Hyperlipidemia associated with type 2 diabetes mellitus (HCC)  Type 2 diabetes mellitus with complication  (HCC)  GAD (generalized anxiety disorder)  Depression, recurrent (HCC)   Not due for labs. Printed and get done in December.  Follow up in office in march.  Mood controlled. Stay on viibryd. Sugars improving. Ok to increase tresiba up by 2 units every 4-5 days until fasting sugars are under 130.  Continue to watch sugars and carbs in diet.  Continue on statin.  BP controlled. Continue on lisinopril.  Will recheck TSH and adjust accordingly.  Flu and pneumonia shot up to date.

## 2019-05-25 ENCOUNTER — Encounter: Payer: Self-pay | Admitting: Physician Assistant

## 2019-06-08 ENCOUNTER — Telehealth: Payer: Self-pay | Admitting: Physician Assistant

## 2019-06-08 DIAGNOSIS — F3342 Major depressive disorder, recurrent, in full remission: Secondary | ICD-10-CM

## 2019-06-08 MED ORDER — VENLAFAXINE HCL 37.5 MG PO TABS: 37.5000 mg | ORAL_TABLET | Freq: Two times a day (BID) | ORAL | 1 refills | Status: DC

## 2019-06-11 ENCOUNTER — Emergency Department (INDEPENDENT_AMBULATORY_CARE_PROVIDER_SITE_OTHER)
Admission: EM | Admit: 2019-06-11 | Discharge: 2019-06-11 | Disposition: A | Discharge status: Home / Self Care | Payer: Self-pay | Source: Home / Self Care | Attending: Family Medicine | Admitting: Family Medicine

## 2019-06-11 ENCOUNTER — Other Ambulatory Visit: Payer: Self-pay

## 2019-06-11 ENCOUNTER — Emergency Department (INDEPENDENT_AMBULATORY_CARE_PROVIDER_SITE_OTHER): Payer: BC Managed Care – PPO

## 2019-06-11 ENCOUNTER — Encounter: Payer: Self-pay | Admitting: Emergency Medicine

## 2019-06-11 DIAGNOSIS — S161XXA Strain of muscle, fascia and tendon at neck level, initial encounter: Secondary | ICD-10-CM

## 2019-06-11 DIAGNOSIS — S199XXA Unspecified injury of neck, initial encounter: Secondary | ICD-10-CM | POA: Diagnosis not present

## 2019-06-11 DIAGNOSIS — S39012A Strain of muscle, fascia and tendon of lower back, initial encounter: Secondary | ICD-10-CM

## 2019-06-11 DIAGNOSIS — M5412 Radiculopathy, cervical region: Secondary | ICD-10-CM

## 2019-06-11 DIAGNOSIS — S29012A Strain of muscle and tendon of back wall of thorax, initial encounter: Secondary | ICD-10-CM

## 2019-06-11 DIAGNOSIS — M542 Cervicalgia: Secondary | ICD-10-CM | POA: Diagnosis not present

## 2019-06-11 MED ORDER — HYDROCODONE-ACETAMINOPHEN 5-325 MG PO TABS: ORAL_TABLET | ORAL | 0 refills | Status: DC

## 2019-06-11 MED ORDER — CYCLOBENZAPRINE HCL 10 MG PO TABS: 10.0000 mg | ORAL_TABLET | Freq: Two times a day (BID) | ORAL | 0 refills | Status: DC | PRN

## 2019-06-11 MED ORDER — IBUPROFEN 600 MG PO TABS
600.0000 mg | ORAL_TABLET | Freq: Once | ORAL | Status: AC
  Administered 2019-06-11: 600 mg via ORAL

## 2019-06-11 MED ORDER — PREDNISONE 20 MG PO TABS: ORAL_TABLET | ORAL | 0 refills | Status: DC
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
## 2019-06-11 NOTE — Discharge Instructions (Addendum)
Apply ice pack for 20 to 30 minutes, 3 to 4 times daily  Continue until pain decreases.  Begin stretching and range of motion exercises as tolerated. °

## 2019-06-17 ENCOUNTER — Other Ambulatory Visit: Payer: Self-pay | Admitting: Physician Assistant

## 2019-06-17 DIAGNOSIS — E118 Type 2 diabetes mellitus with unspecified complications: Secondary | ICD-10-CM

## 2019-06-17 DIAGNOSIS — Z794 Long term (current) use of insulin: Secondary | ICD-10-CM

## 2019-07-13 ENCOUNTER — Other Ambulatory Visit: Payer: Self-pay | Admitting: Physician Assistant

## 2019-07-13 DIAGNOSIS — E118 Type 2 diabetes mellitus with unspecified complications: Secondary | ICD-10-CM

## 2019-07-13 DIAGNOSIS — Z794 Long term (current) use of insulin: Secondary | ICD-10-CM

## 2019-07-17 ENCOUNTER — Other Ambulatory Visit: Payer: Self-pay | Admitting: Physician Assistant

## 2019-07-17 DIAGNOSIS — E039 Hypothyroidism, unspecified: Secondary | ICD-10-CM

## 2019-08-07 ENCOUNTER — Other Ambulatory Visit: Payer: Self-pay | Admitting: Physician Assistant

## 2019-08-07 DIAGNOSIS — F339 Major depressive disorder, recurrent, unspecified: Secondary | ICD-10-CM

## 2019-08-07 DIAGNOSIS — F411 Generalized anxiety disorder: Secondary | ICD-10-CM

## 2019-09-20 ENCOUNTER — Other Ambulatory Visit: Payer: Self-pay | Admitting: Physician Assistant

## 2019-09-26 ENCOUNTER — Other Ambulatory Visit: Payer: Self-pay | Admitting: Physician Assistant

## 2019-09-26 DIAGNOSIS — Z794 Long term (current) use of insulin: Secondary | ICD-10-CM

## 2019-09-26 DIAGNOSIS — I10 Essential (primary) hypertension: Secondary | ICD-10-CM

## 2019-09-26 DIAGNOSIS — E1169 Type 2 diabetes mellitus with other specified complication: Secondary | ICD-10-CM

## 2019-09-26 DIAGNOSIS — E118 Type 2 diabetes mellitus with unspecified complications: Secondary | ICD-10-CM

## 2019-09-28 ENCOUNTER — Ambulatory Visit: Payer: BC Managed Care – PPO | Admitting: Physician Assistant

## 2019-11-25 ENCOUNTER — Other Ambulatory Visit: Payer: Self-pay | Admitting: Physician Assistant

## 2019-11-25 DIAGNOSIS — E039 Hypothyroidism, unspecified: Secondary | ICD-10-CM

## 2019-11-28 ENCOUNTER — Ambulatory Visit: Payer: Self-pay | Admitting: Physician Assistant

## 2019-12-19 ENCOUNTER — Ambulatory Visit: Payer: Self-pay | Admitting: Physician Assistant

## 2019-12-25 ENCOUNTER — Other Ambulatory Visit: Payer: Self-pay | Admitting: Physician Assistant

## 2019-12-25 DIAGNOSIS — E039 Hypothyroidism, unspecified: Secondary | ICD-10-CM

## 2020-01-11 ENCOUNTER — Other Ambulatory Visit: Payer: Self-pay

## 2020-01-11 ENCOUNTER — Emergency Department (INDEPENDENT_AMBULATORY_CARE_PROVIDER_SITE_OTHER)
Admission: EM | Admit: 2020-01-11 | Discharge: 2020-01-11 | Disposition: A | Discharge status: Home / Self Care | Payer: Self-pay | Source: Home / Self Care | Attending: Family Medicine | Admitting: Family Medicine

## 2020-01-11 DIAGNOSIS — J069 Acute upper respiratory infection, unspecified: Secondary | ICD-10-CM

## 2020-01-11 DIAGNOSIS — J0100 Acute maxillary sinusitis, unspecified: Secondary | ICD-10-CM

## 2020-01-11 MED ORDER — AMOXICILLIN 875 MG PO TABS: 875.0000 mg | ORAL_TABLET | Freq: Two times a day (BID) | ORAL | 0 refills | Status: DC

## 2020-01-11 NOTE — Discharge Instructions (Addendum)
Take plain guaifenesin (1200mg  extended release tabs such as Mucinex) twice daily, with plenty of water, for cough and congestion. Get adequate rest.   May use Afrin nasal spray (or generic oxymetazoline) each morning for about 5 days and then discontinue.  Also recommend using saline nasal spray several times daily and saline nasal irrigation (AYR is a common brand).  Use Flonase nasal spray each morning after using Afrin nasal spray and saline nasal irrigation. Try warm salt water gargles for sore throat.  Stop all antihistamines for now, and other non-prescription cough/cold preparations. May take Ibuprofen 200mg , 4 tabs every 8 hours with food for headache and facial pain. May take Delsym Cough Suppressant at bedtime for nighttime cough.

## 2020-01-11 NOTE — ED Triage Notes (Signed)
Sinus pain, pressure mostly RT side, eye pain, ear pain, teeth hurt,facial pain X 4 days, slight cough and sneezing.

## 2020-01-11 NOTE — ED Provider Notes (Signed)
[facility]    CSN: [id number] Arrival date & time: 01/11/20  2426      History   Chief Complaint Chief Complaint  Patient presents with  . Sinus Problem    HPI Austin Weeks is a 44 y.o. male.   Patient complains of six day history of typical cold-like symptoms developing over several days, including mild sore throat, sinus congestion, headache, fatigue, and cough.  Yesterday he developed pain/pressure in his right cheek, with aching in his right upper teeth.  He has had chills at night during the past several days.  The history is provided by the patient.    Past Medical History:  Diagnosis Date  . Depressed   . Diabetes mellitus without complication (HCC)   . Heart murmur    as a child  . Hypertension   . Hypothyroidism     Patient Active Problem List   Diagnosis Date Noted  . Eosinophils increased 04/24/2019  . Acute recurrent pancreatitis 04/24/2019  . Stress at home 03/29/2019  . Right inguinal hernia 12/27/2017  . GAD (generalized anxiety disorder) 06/06/2017  . Depression, recurrent (HCC) 06/06/2017  . Erectile dysfunction due to diseases classified elsewhere 06/06/2017  . Poor dentition 05/04/2016  . Tooth pain 10/24/2015  . Depression 05/01/2015  . Generalized anxiety disorder 04/28/2015  . Type 2 diabetes mellitus with complication (HCC) 04/28/2015  . Other specified hypothyroidism 04/28/2015  . Essential hypertension 01/27/2010  . Hypothyroidism 01/06/2010  . DM 01/06/2010  . Hyperlipidemia associated with type 2 diabetes mellitus (HCC) 01/06/2010  . FATIGUE 01/05/2010  . SHORTNESS OF BREATH 01/05/2010  . CHEST PAIN, LEFT 01/05/2010  . OBESITY, UNSPECIFIED 05/15/2009  . DEPRESSIVE DISORDER NOT ELSEWHERE CLASSIFIED 05/15/2009  . WEIGHT GAIN 05/15/2009  . TOBACCO USE, QUIT 05/15/2009    Past Surgical History:  Procedure Laterality Date  . CHOLECYSTECTOMY         Home Medications    Prior to Admission medications     Medication Sig Start Date End Date Taking? Authorizing Provider  ACCU-CHEK GUIDE test strip USE AS DIRECTED 09/20/19   Breeback, Jade L, PA-C  AMBULATORY NON FORMULARY MEDICATION One touch ultra 2  Testing 2 twice a day. For Diabetes Mellitus type II, uncontrolled. 03/26/19   Breeback, Jade L, PA-C  amoxicillin (AMOXIL) 875 MG tablet Take 1 tablet (875 mg total) by mouth 2 (two) times daily. 01/11/20   Lattie Haw, MD  atorvastatin (LIPITOR) 40 MG tablet Take 1 tablet (40 mg total) by mouth daily at 6 PM. NEEDS APPT/LABS 09/26/19   Jomarie Longs, PA-C  Blood Glucose Monitoring Suppl (ACCU-CHEK AVIVA) device Use as instructed 03/27/19 03/26/20  Breeback, Lesly Rubenstein L, PA-C  famotidine (PEPCID) 20 MG tablet TAKE 1 TABLET BY MOUTH TWICE A DAY 05/07/19   Breeback, Jade L, PA-C  glipiZIDE (GLUCOTROL) 5 MG tablet TAKE 1 TABLET (5 MG TOTAL) BY MOUTH 2 (TWO) TIMES DAILY BEFORE A MEAL. 06/18/19   Jomarie Longs, PA-C  HYDROcodone-acetaminophen (NORCO/VICODIN) 5-325 MG tablet Take one by mouth at bedtime as needed for pain 06/11/19   Lattie Haw, MD  Insulin Degludec (TRESIBA FLEXTOUCH) 200 UNIT/ML SOPN Inject 26 Units into the skin at bedtime. 03/26/19   Breeback, Lonna Cobb, PA-C  levothyroxine (SYNTHROID) 100 MCG tablet TAKE 1 TABLET (100 MCG TOTAL) BY MOUTH DAILY BEFORE BREAKFAST. NEEDS LABS 12/25/19   Tandy Gaw L, PA-C  lisinopril (ZESTRIL) 10 MG tablet Take 1 tablet (10 mg total) by mouth daily. NEEDS APPT/LABS  09/26/19   Breeback, Jade L, PA-C  lisinopril (ZESTRIL) 20 MG tablet Take 1 tablet (20 mg total) by mouth daily. 04/23/19   Jomarie Longs, PA-C  Monolet Lancets MISC by Does not apply route as directed.      [provider]  sildenafil (VIAGRA) 100 MG tablet Take 1 tablet (100 mg total) by mouth daily as needed for erectile dysfunction. 03/26/19   Breeback, Jade L, PA-C  venlafaxine (EFFEXOR) 37.5 MG tablet Take 1 tablet (37.5 mg total) by mouth 2 (two) times daily. 06/08/19   Breeback,  Jade L, PA-C  VIIBRYD 20 MG TABS TAKE 1 TABLET BY MOUTH EVERY DAY 08/07/19   Breeback, Jade L, PA-C  XIGDUO XR 04-999 MG TB24 TAKE 1 TABLET BY MOUTH EVERY DAY 07/13/19   Jomarie Longs, PA-C    Family History Family History  Problem Relation Age of Onset  . Depression Mother   . Diabetes Mother   . Hypertension Mother   . Hyperlipidemia Mother   . Sleep apnea Maternal Uncle   . Alcohol abuse Maternal Uncle   . Diabetes Maternal Uncle   . Coronary artery disease Maternal Grandfather   . Heart attack Maternal Grandfather   . Alcohol abuse Paternal Grandfather   . Stroke Paternal Grandfather   . Diabetes Maternal Aunt   . Alcohol abuse Paternal Uncle   . Cancer Maternal Grandmother   . Diabetes Maternal Aunt     Social History Social History   Tobacco Use  . Smoking status: Former Games developer  . Smokeless tobacco: Never Used  . Tobacco comment: quit Jan 2010   Vaping Use  . Vaping Use: Never used  Substance Use Topics  . Alcohol use: Yes    Comment: 3 etoh/week  . Drug use: No     Allergies   Ozempic (0.25 or 0.5 mg-dose) [semaglutide(0.25 or 0.5mg -dos)]   Review of Systems Review of Systems + sore throat + cough No pleuritic pain No wheezing + nasal congestion + post-nasal drainage + sinus pain/pressure No itchy/red eyes No earache No hemoptysis No SOB No fever, + chills No nausea No vomiting No abdominal pain No diarrhea No urinary symptoms No skin rash + fatigue No myalgias + headache Used OTC meds (Theraflu) without relief   Physical Exam Triage Vital Signs ED Triage Vitals  Enc Vitals Group     BP 01/11/20 0943 (!) 142/99     Pulse Rate 01/11/20 0943 94     Resp 01/11/20 0943 18     Temp 01/11/20 0943 98.1 F (36.7 C)     Temp Source 01/11/20 0943 Oral     SpO2 01/11/20 0943 98 %     Weight 01/11/20 0944 245 lb (111.1 kg)     Height 01/11/20 0944 6\' 1"  (1.854 m)     Head Circumference --      Peak Flow --      Pain Score 01/11/20  0943 7     Pain Loc --      Pain Edu? --      Excl. in GC? --    No data found.  Updated Vital Signs BP (!) 142/99 (BP Location: Right Arm)   Pulse 94   Temp 98.1 F (36.7 C) (Oral)   Resp 18   Ht 6\' 1"  (1.854 m)   Wt 111.1 kg   SpO2 98%   BMI 32.32 kg/m   Visual Acuity Right Eye Distance:   Left Eye Distance:   Bilateral Distance:  Right Eye Near:   Left Eye Near:    Bilateral Near:     Physical Exam Nursing notes and Vital Signs reviewed. Appearance:  Patient appears stated age, and in no acute distress Eyes:  Pupils are equal, round, and reactive to light and accomodation.  Extraocular movement is intact.  Conjunctivae are not inflamed  Ears:  Canals normal.  Tympanic membranes normal.  Nose:  Congested turbinates. Maxillary sinus tenderness is present on the right. Pharynx:  Normal Neck:  Supple.  Mildly enlarged lateral nodes are present, tender to palpation on the right.   Lungs:  Clear to auscultation.  Breath sounds are equal.  Moving air well. Heart:  Regular rate and rhythm without murmurs, rubs, or gallops.  Abdomen:  Nontender without masses or hepatosplenomegaly.  Bowel sounds are present.  No CVA or flank tenderness.  Extremities:  No edema.  Skin:  No rash present.   UC Treatments / Results  Labs (all labs ordered are listed, but only abnormal results are displayed) Labs Reviewed - No data to display  EKG   Radiology No results found.  Procedures Procedures (including critical care time)  Medications Ordered in UC Medications - No data to display  Initial Impression / Assessment and Plan / UC Course  I have reviewed the triage vital signs and the nursing notes.  Pertinent labs & imaging results that were available during my care of the patient were reviewed by me and considered in my medical decision making (see chart for details).    Begin amoxicillin. Followup with Family Doctor if not improved in about 10 days.  Final Clinical  Impressions(s) / UC Diagnoses   Final diagnoses:  Viral URI with cough  Acute maxillary sinusitis, recurrence not specified     Discharge Instructions     Take plain guaifenesin (1200mg  extended release tabs such as Mucinex) twice daily, with plenty of water, for cough and congestion. Get adequate rest.   May use Afrin nasal spray (or generic oxymetazoline) each morning for about 5 days and then discontinue.  Also recommend using saline nasal spray several times daily and saline nasal irrigation (AYR is a common brand).  Use Flonase nasal spray each morning after using Afrin nasal spray and saline nasal irrigation. Try warm salt water gargles for sore throat.  Stop all antihistamines for now, and other non-prescription cough/cold preparations. May take Ibuprofen 200mg , 4 tabs every 8 hours with food for headache and facial pain. May take Delsym Cough Suppressant at bedtime for nighttime cough.      ED Prescriptions    Medication Sig Dispense Auth. Provider   amoxicillin (AMOXIL) 875 MG tablet Take 1 tablet (875 mg total) by mouth 2 (two) times daily. 20 tablet Kandra Nicolas, MD        Kandra Nicolas, MD 01/11/20 1026

## 2020-03-03 NOTE — Telephone Encounter (Signed)
To close

## 2020-12-30 ENCOUNTER — Telehealth: Payer: Self-pay | Admitting: General Practice

## 2020-12-30 NOTE — Telephone Encounter (Signed)
Transition Care Management Unsuccessful Follow-up Telephone Call  Date of discharge and from where:  [facility] 12/29/20  Attempts:  1st Attempt  Reason for unsuccessful TCM follow-up call:  Left voice message

## 2020-12-31 NOTE — Telephone Encounter (Signed)
Transition Care Management Unsuccessful Follow-up Telephone Call  Date of discharge and from where:  [facility] 12/29/20  Attempts:  2nd Attempt  Reason for unsuccessful TCM follow-up call:  Left voice message

## 2021-01-01 NOTE — Telephone Encounter (Signed)
Transition Care Management Unsuccessful Follow-up Telephone Call  Date of discharge and from where:  12/29/2020 from [facility]  Attempts:  3rd Attempt  Reason for unsuccessful TCM follow-up call:  Unable to reach patient

## 2021-02-24 ENCOUNTER — Encounter: Payer: Self-pay | Admitting: Physician Assistant

## 2021-02-24 ENCOUNTER — Ambulatory Visit: Payer: 59 | Admitting: Physician Assistant

## 2021-02-24 ENCOUNTER — Other Ambulatory Visit: Payer: Self-pay

## 2021-02-24 VITALS — BP 138/101 | HR 87 | Temp 98.7°F | Ht 73.0 in | Wt 230.0 lb

## 2021-02-24 DIAGNOSIS — Z114 Encounter for screening for human immunodeficiency virus [HIV]: Secondary | ICD-10-CM

## 2021-02-24 DIAGNOSIS — Z794 Long term (current) use of insulin: Secondary | ICD-10-CM

## 2021-02-24 DIAGNOSIS — F339 Major depressive disorder, recurrent, unspecified: Secondary | ICD-10-CM

## 2021-02-24 DIAGNOSIS — E1169 Type 2 diabetes mellitus with other specified complication: Secondary | ICD-10-CM

## 2021-02-24 DIAGNOSIS — E118 Type 2 diabetes mellitus with unspecified complications: Secondary | ICD-10-CM

## 2021-02-24 DIAGNOSIS — Z1159 Encounter for screening for other viral diseases: Secondary | ICD-10-CM

## 2021-02-24 DIAGNOSIS — F411 Generalized anxiety disorder: Secondary | ICD-10-CM

## 2021-02-24 DIAGNOSIS — E785 Hyperlipidemia, unspecified: Secondary | ICD-10-CM

## 2021-02-24 DIAGNOSIS — K047 Periapical abscess without sinus: Secondary | ICD-10-CM

## 2021-02-24 DIAGNOSIS — E1165 Type 2 diabetes mellitus with hyperglycemia: Secondary | ICD-10-CM

## 2021-02-24 DIAGNOSIS — R1013 Epigastric pain: Secondary | ICD-10-CM | POA: Diagnosis not present

## 2021-02-24 DIAGNOSIS — Z5320 Procedure and treatment not carried out because of patient's decision for unspecified reasons: Secondary | ICD-10-CM

## 2021-02-24 DIAGNOSIS — R1012 Left upper quadrant pain: Secondary | ICD-10-CM

## 2021-02-24 DIAGNOSIS — I10 Essential (primary) hypertension: Secondary | ICD-10-CM | POA: Diagnosis not present

## 2021-02-24 DIAGNOSIS — R140 Abdominal distension (gaseous): Secondary | ICD-10-CM

## 2021-02-24 DIAGNOSIS — E039 Hypothyroidism, unspecified: Secondary | ICD-10-CM | POA: Diagnosis not present

## 2021-02-24 LAB — POCT GLYCOSYLATED HEMOGLOBIN (HGB A1C): Hemoglobin A1C: 14 % — AB (ref 4.0–5.6)

## 2021-02-24 LAB — TSH: TSH: >150 mIU/L — ABNORMAL HIGH (ref 0.40–4.50)

## 2021-02-24 MED ORDER — AMOXICILLIN 875 MG PO TABS: 875.0000 mg | ORAL_TABLET | Freq: Two times a day (BID) | ORAL | 0 refills | Status: DC

## 2021-02-24 MED ORDER — LEVOTHYROXINE SODIUM 100 MCG PO TABS: 100.0000 ug | ORAL_TABLET | Freq: Every day | ORAL | 1 refills | Status: DC

## 2021-02-24 MED ORDER — VILAZODONE HCL 20 MG PO TABS: 1.0000 | ORAL_TABLET | Freq: Every day | ORAL | 1 refills | Status: DC

## 2021-02-24 MED ORDER — TRESIBA FLEXTOUCH 200 UNIT/ML ~~LOC~~ SOPN: 25.0000 [IU] | PEN_INJECTOR | Freq: Every day | SUBCUTANEOUS | 1 refills | Status: DC

## 2021-02-24 MED ORDER — FREESTYLE LIBRE 14 DAY SENSOR MISC: 1.0000 "application " | 11 refills | Status: DC

## 2021-02-24 MED ORDER — ATORVASTATIN CALCIUM 40 MG PO TABS: 40.0000 mg | ORAL_TABLET | Freq: Every day | ORAL | 3 refills | Status: DC

## 2021-02-24 MED ORDER — GLIPIZIDE 5 MG PO TABS: 5.0000 mg | ORAL_TABLET | Freq: Two times a day (BID) | ORAL | 0 refills | Status: DC

## 2021-02-24 MED ORDER — LISINOPRIL 20 MG PO TABS: 20.0000 mg | ORAL_TABLET | Freq: Every day | ORAL | 0 refills | Status: DC

## 2021-02-24 MED ORDER — PANTOPRAZOLE SODIUM 40 MG PO TBEC: 40.0000 mg | DELAYED_RELEASE_TABLET | Freq: Every day | ORAL | 2 refills | Status: DC

## 2021-02-24 MED ORDER — XIGDUO XR 10-1000 MG PO TB24: 1.0000 | ORAL_TABLET | Freq: Every day | ORAL | 0 refills | Status: DC

## 2021-02-24 NOTE — Patient Instructions (Signed)
Diabetes Mellitus and Nutrition, Adult When you have diabetes, or diabetes mellitus, it is very important to have healthy eating habits because your blood sugar (glucose) levels are greatly affected by what you eat and drink. Eating healthy foods in the right amounts, at about the same times every day, can help you:  Control your blood glucose.  Lower your risk of heart disease.  Improve your blood pressure.  Reach or maintain a healthy weight. What can affect my meal plan? Every person with diabetes is different, and each person has different needs for a meal plan. Your health care provider may recommend that you work with a dietitian to make a meal plan that is best for you. Your meal plan may vary depending on factors such as:  The calories you need.  The medicines you take.  Your weight.  Your blood glucose, blood pressure, and cholesterol levels.  Your activity level.  Other health conditions you have, such as heart or kidney disease. How do carbohydrates affect me? Carbohydrates, also called carbs, affect your blood glucose level more than any other type of food. Eating carbs naturally raises the amount of glucose in your blood. Carb counting is a method for keeping track of how many carbs you eat. Counting carbs is important to keep your blood glucose at a healthy level, especially if you use insulin or take certain oral diabetes medicines. It is important to know how many carbs you can safely have in each meal. This is different for every person. Your dietitian can help you calculate how many carbs you should have at each meal and for each snack. How does alcohol affect me? Alcohol can cause a sudden decrease in blood glucose (hypoglycemia), especially if you use insulin or take certain oral diabetes medicines. Hypoglycemia can be a life-threatening condition. Symptoms of hypoglycemia, such as sleepiness, dizziness, and confusion, are similar to symptoms of having too much  alcohol.  Do not drink alcohol if: ? Your health care provider tells you not to drink. ? You are pregnant, may be pregnant, or are planning to become pregnant.  If you drink alcohol: ? Do not drink on an empty stomach. ? Limit how much you use to:  0-1 drink a day for women.  0-2 drinks a day for men. ? Be aware of how much alcohol is in your drink. In the [place] one drink equals one 12 oz bottle of beer (355 mL), one 5 oz glass of wine (148 mL), or one 1 oz glass of hard liquor (44 mL). ? Keep yourself hydrated with water, diet soda, or unsweetened iced tea.  Keep in mind that regular soda, juice, and other mixers may contain a lot of sugar and must be counted as carbs. What are tips for following this plan? Reading food labels  Start by checking the serving size on the "Nutrition Facts" label of packaged foods and drinks. The amount of calories, carbs, fats, and other nutrients listed on the label is based on one serving of the item. Many items contain more than one serving per package.  Check the total grams (g) of carbs in one serving. You can calculate the number of servings of carbs in one serving by dividing the total carbs by 15. For example, if a food has 30 g of total carbs per serving, it would be equal to 2 servings of carbs.  Check the number of grams (g) of saturated fats and trans fats in one serving. Choose foods that have   a low amount or none of these fats.  Check the number of milligrams (mg) of salt (sodium) in one serving. Most people should limit total sodium intake to less than 2,300 mg per day.  Always check the nutrition information of foods labeled as "low-fat" or "nonfat." These foods may be higher in added sugar or refined carbs and should be avoided.  Talk to your dietitian to identify your daily goals for nutrients listed on the label. Shopping  Avoid buying canned, pre-made, or processed foods. These foods tend to be high in fat, sodium, and added  sugar.  Shop around the outside edge of the grocery store. This is where you will most often find fresh fruits and vegetables, bulk grains, fresh meats, and fresh dairy. Cooking  Use low-heat cooking methods, such as baking, instead of high-heat cooking methods like deep frying.  Cook using healthy oils, such as olive, canola, or sunflower oil.  Avoid cooking with butter, cream, or high-fat meats. Meal planning  Eat meals and snacks regularly, preferably at the same times every day. Avoid going long periods of time without eating.  Eat foods that are high in fiber, such as fresh fruits, vegetables, beans, and whole grains. Talk with your dietitian about how many servings of carbs you can eat at each meal.  Eat 4-6 oz (112-168 g) of lean protein each day, such as lean meat, chicken, fish, eggs, or tofu. One ounce (oz) of lean protein is equal to: ? 1 oz (28 g) of meat, chicken, or fish. ? 1 egg. ?  cup (62 g) of tofu.  Eat some foods each day that contain healthy fats, such as avocado, nuts, seeds, and fish.   What foods should I eat? Fruits Berries. Apples. Oranges. Peaches. Apricots. Plums. Grapes. Mango. Papaya. Pomegranate. Kiwi. Cherries. Vegetables Lettuce. Spinach. Leafy greens, including kale, chard, collard greens, and mustard greens. Beets. Cauliflower. Cabbage. Broccoli. Carrots. Green beans. Tomatoes. Peppers. Onions. Cucumbers. Brussels sprouts. Grains Whole grains, such as whole-wheat or whole-grain bread, crackers, tortillas, cereal, and pasta. Unsweetened oatmeal. Quinoa. Brown or wild rice. Meats and other proteins Seafood. Poultry without skin. Lean cuts of poultry and beef. Tofu. Nuts. Seeds. Dairy Low-fat or fat-free dairy products such as milk, yogurt, and cheese. The items listed above may not be a complete list of foods and beverages you can eat. Contact a dietitian for more information. What foods should I avoid? Fruits Fruits canned with  syrup. Vegetables Canned vegetables. Frozen vegetables with butter or cream sauce. Grains Refined white flour and flour products such as bread, pasta, snack foods, and cereals. Avoid all processed foods. Meats and other proteins Fatty cuts of meat. Poultry with skin. Breaded or fried meats. Processed meat. Avoid saturated fats. Dairy Full-fat yogurt, cheese, or milk. Beverages Sweetened drinks, such as soda or iced tea. The items listed above may not be a complete list of foods and beverages you should avoid. Contact a dietitian for more information. Questions to ask a health care provider  Do I need to meet with a diabetes educator?  Do I need to meet with a dietitian?  What number can I call if I have questions?  When are the best times to check my blood glucose? Where to find more information:  American Diabetes Association: diabetes.org  Academy of Nutrition and Dietetics: www.eatright.org  National Institute of Diabetes and Digestive and Kidney Diseases: www.niddk.nih.gov  Association of Diabetes Care and Education Specialists: www.diabeteseducator.org Summary  It is important to have healthy eating   habits because your blood sugar (glucose) levels are greatly affected by what you eat and drink.  A healthy meal plan will help you control your blood glucose and maintain a healthy lifestyle.  Your health care provider may recommend that you work with a dietitian to make a meal plan that is best for you.  Keep in mind that carbohydrates (carbs) and alcohol have immediate effects on your blood glucose levels. It is important to count carbs and to use alcohol carefully. This information is not intended to replace advice given to you by your health care provider. Make sure you discuss any questions you have with your health care provider. Document Revised: 06/19/2019 Document Reviewed: 06/19/2019 Elsevier Patient Education  2021 Elsevier Inc.  

## 2021-02-24 NOTE — Progress Notes (Signed)
Subjective:    Patient ID: Austin Weeks, male    DOB: 20-Mar-1976, 45 y.o.   MRN: [id number]  HPI Pt is a 45 yo obese male with uncontrolled T2DM, recurrent pancreatitis, HLD, hypothyroidism, and  Not been here in over a 2 years due to losing his job.   He has been out of all medications for 8 months.  without medications. He went to ED on 6/6 for stomach and CP. Labs were normal. Ct of abdomen were normal. He feels nauseated and full all the time. He is concerned about his heart and body. He has constipation. Denies any melena or hematochezia.   Right back lower molar painful and appears infected. Requesting abx.   Pretty anxious. Vibryd did great in the past would like to restart.    .. Active Ambulatory Problems    Diagnosis Date Noted   Hypothyroidism 01/06/2010   DM 01/06/2010   Hyperlipidemia associated with type 2 diabetes mellitus (HCC) 01/06/2010   OBESITY, UNSPECIFIED 05/15/2009   DEPRESSIVE DISORDER NOT ELSEWHERE CLASSIFIED 05/15/2009   Essential hypertension 01/27/2010   FATIGUE 01/05/2010   WEIGHT GAIN 05/15/2009   SHORTNESS OF BREATH 01/05/2010   CHEST PAIN, LEFT 01/05/2010   TOBACCO USE, QUIT 05/15/2009   Generalized anxiety disorder 04/28/2015   Type 2 diabetes mellitus with complication (HCC) 04/28/2015   Other specified hypothyroidism 04/28/2015   Depression 05/01/2015   Tooth pain 10/24/2015   Poor dentition 05/04/2016   GAD (generalized anxiety disorder) 06/06/2017   Depression, recurrent (HCC) 06/06/2017   Erectile dysfunction due to diseases classified elsewhere 06/06/2017   Right inguinal hernia 12/27/2017   Stress at home 03/29/2019   Eosinophils increased 04/24/2019   Acute recurrent pancreatitis 04/24/2019   Resolved Ambulatory Problems    Diagnosis Date Noted   No Resolved Ambulatory Problems   Past Medical History:  Diagnosis Date   Depressed    Diabetes mellitus without complication (HCC)    Heart murmur    Hypertension            Review of Systems See HPI.     Objective:   Physical Exam Vitals reviewed.  Constitutional:      Comments: Pale apperance  HENT:     Head: Normocephalic.  Neck:     Vascular: No carotid bruit.  Cardiovascular:     Rate and Rhythm: Normal rate and regular rhythm.     Pulses: Normal pulses.  Pulmonary:     Effort: Pulmonary effort is normal.     Breath sounds: Normal breath sounds.  Abdominal:     General: Bowel sounds are normal. There is distension.     Palpations: Abdomen is soft. There is no mass.     Tenderness: There is abdominal tenderness. There is no right CVA tenderness, left CVA tenderness, guarding or rebound.     Comments: Epigastric and LUQ tenderness and distention.  Musculoskeletal:     Right lower leg: No edema.     Left lower leg: No edema.  Neurological:     General: No focal deficit present.     Mental Status: He is alert and oriented to person, place, and time.  Psychiatric:     Comments: Worried and anxious      .Marland Kitchen Lab Results  Component Value Date   HGBA1C 14.0 (A) 02/24/2021       Assessment & Plan:  Marland KitchenMarland KitchenDwayn was seen today for diabetes, hypertension, hyperlipidemia, hypothyroidism, anxiety and depression.  Diagnoses and all orders for this visit:  Epigastric pain -     COMPLETE METABOLIC PANEL WITH GFR -     Lipase -     H. pylori breath test -     CBC w/Diff/Platelet -     pantoprazole (PROTONIX) 40 MG tablet; Take 1 tablet (40 mg total) by mouth daily.  Essential hypertension -     lisinopril (ZESTRIL) 20 MG tablet; Take 1 tablet (20 mg total) by mouth daily.  Hyperlipidemia associated with type 2 diabetes mellitus (HCC) -     atorvastatin (LIPITOR) 40 MG tablet; Take 1 tablet (40 mg total) by mouth daily at 6 PM.  Hypothyroidism, unspecified type -     TSH -     levothyroxine (SYNTHROID) 100 MCG tablet; Take 1 tablet (100 mcg total) by mouth daily before breakfast.  Uncontrolled type 2 diabetes mellitus with  hyperglycemia (HCC) -     POCT glycosylated hemoglobin (Hb A1C) -     Continuous Blood Gluc Sensor (FREESTYLE LIBRE 14 DAY SENSOR) MISC; 1 application by Does not apply route every 14 (fourteen) days. Apply upper deltoid every 14 days, use reader to determine blood sugars -     insulin glargine, 1 Unit Dial, (TOUJEO SOLOSTAR) 300 UNIT/ML Solostar Pen; Inject 26 Units into the skin at bedtime. -     Empagliflozin-metFORMIN HCl (SYNJARDY) 12.11-998 MG TABS; Take 1 tablet by mouth 2 (two) times daily.  GAD (generalized anxiety disorder) -     Vilazodone HCl (VIIBRYD) 20 MG TABS; Take 1 tablet (20 mg total) by mouth daily.  Depression, recurrent (HCC) -     Vilazodone HCl (VIIBRYD) 20 MG TABS; Take 1 tablet (20 mg total) by mouth daily.  Left upper quadrant pain -     COMPLETE METABOLIC PANEL WITH GFR -     Lipase -     H. pylori breath test -     CBC w/Diff/Platelet  Encounter for hepatitis C screening test for low risk patient -     Hepatitis C antibody  Screening for HIV (human immunodeficiency virus) -     HIV Antibody (routine testing w rflx)  Type 2 diabetes mellitus with complication, with long-term current use of insulin (HCC) -     insulin degludec (TRESIBA FLEXTOUCH) 200 UNIT/ML FlexTouch Pen; Inject 26 Units into the skin at bedtime. -     Dapagliflozin-metFORMIN HCl ER (XIGDUO XR) 04-999 MG TB24; Take 1 tablet by mouth daily. -     glipiZIDE (GLUCOTROL) 5 MG tablet; Take 1 tablet (5 mg total) by mouth 2 (two) times daily before a meal.  HIV screening declined  Tooth infection -     amoxicillin (AMOXIL) 875 MG tablet; Take 1 tablet (875 mg total) by mouth 2 (two) times daily.  Abdominal distension (gaseous) -     Cancel: CT Abdomen Pelvis Wo Contrast; Future  A1C is terrible.  We must target this and get under control. Likely the reason he is feeling terrible.  Restart insulin and oral medications. Let me know if something is not covered.  BP not to goal. Restart  lisinopril.  Needs eye exam. Pneumonia UtD.  Needs flu shot.  Covid x2. Needs booster.   Epigastric pain/nausea/fullness likely uncontrolled glucose and ? Gastroparesis.  Restart medications.  Breath test done today. Restart prontonix after.  Reassured CT of abdomen normal in ED.   Amoxil given for dental infection. Follow up with dentist for further management.   Restart mood medication.   Follow up in 3 months.

## 2021-02-25 LAB — HEPATITIS C ANTIBODY
Hepatitis C Ab: NONREACTIVE
SIGNAL TO CUT-OFF: 0.01 (ref <1.00)

## 2021-02-25 LAB — COMPLETE METABOLIC PANEL WITH GFR
AG Ratio: 1.8 (calc) (ref 1.0–2.5)
ALT: 25 U/L (ref 9–46)
AST: 28 U/L (ref 10–40)
Albumin: 4.7 g/dL (ref 3.6–5.1)
Alkaline phosphatase (APISO): 94 U/L (ref 36–130)
BUN/Creatinine Ratio: 10 (calc) (ref 6–22)
BUN: 17 mg/dL (ref 7–25)
CO2: 31 mmol/L (ref 20–32)
Calcium: 10.4 mg/dL — ABNORMAL HIGH (ref 8.6–10.3)
Chloride: 93 mmol/L — ABNORMAL LOW (ref 98–110)
Creat: 1.72 mg/dL — ABNORMAL HIGH (ref 0.60–1.29)
Globulin: 2.6 g/dL (calc) (ref 1.9–3.7)
Glucose, Bld: 316 mg/dL — ABNORMAL HIGH (ref 65–99)
Potassium: 4.6 mmol/L (ref 3.5–5.3)
Sodium: 134 mmol/L — ABNORMAL LOW (ref 135–146)
Total Bilirubin: 0.9 mg/dL (ref 0.2–1.2)
Total Protein: 7.3 g/dL (ref 6.1–8.1)
eGFR: 50 mL/min/{1.73_m2} — ABNORMAL LOW (ref >=60)

## 2021-02-25 LAB — CBC WITH DIFFERENTIAL/PLATELET
Absolute Monocytes: 338 cells/uL (ref 200–950)
Basophils Absolute: 86 cells/uL (ref 0–200)
Basophils Relative: 1.2 %
Eosinophils Absolute: 230 cells/uL (ref 15–500)
Eosinophils Relative: 3.2 %
HCT: 50.5 % — ABNORMAL HIGH (ref 38.5–50.0)
Hemoglobin: 16.6 g/dL (ref 13.2–17.1)
Lymphs Abs: 1505 cells/uL (ref 850–3900)
MCH: 28.7 pg (ref 27.0–33.0)
MCHC: 32.9 g/dL (ref 32.0–36.0)
MCV: 87.2 fL (ref 80.0–100.0)
MPV: 11.2 fL (ref 7.5–12.5)
Monocytes Relative: 4.7 %
Neutro Abs: 5040 cells/uL (ref 1500–7800)
Neutrophils Relative %: 70 %
Platelets: 219 10*3/uL (ref 140–400)
RBC: 5.79 10*6/uL (ref 4.20–5.80)
RDW: 13.2 % (ref 11.0–15.0)
Total Lymphocyte: 20.9 %
WBC: 7.2 10*3/uL (ref 3.8–10.8)

## 2021-02-25 LAB — HIV ANTIBODY (ROUTINE TESTING W REFLEX): HIV 1&2 Ab, 4th Generation: NONREACTIVE

## 2021-02-25 LAB — LIPASE: Lipase: 40 U/L (ref 7–60)

## 2021-02-25 LAB — H. PYLORI BREATH TEST: H. pylori Breath Test: NOT DETECTED

## 2021-02-25 NOTE — Progress Notes (Signed)
Austin Weeks,   Hemoglobin normal range.  Thyroid is showing severe hypothyroidism. Start thyroid medication if not improving in next 6 weeks or with medication adjustment need to see endocrinology.  Showing kidney function decline. It could be from the stress of high sugars and uncontrolled thyroid.  Avoid alcohol and anti-inflammatories.  Your calcium is a little elevated. Make sure taking vitamin D 2000 units a day.

## 2021-02-26 ENCOUNTER — Other Ambulatory Visit: Payer: Self-pay | Admitting: Physician Assistant

## 2021-02-26 ENCOUNTER — Encounter: Payer: Self-pay | Admitting: Physician Assistant

## 2021-02-26 DIAGNOSIS — Z794 Long term (current) use of insulin: Secondary | ICD-10-CM

## 2021-02-27 MED ORDER — TOUJEO SOLOSTAR 300 UNIT/ML ~~LOC~~ SOPN: 26.0000 [IU] | PEN_INJECTOR | Freq: Every day | SUBCUTANEOUS | 1 refills | Status: DC

## 2021-02-27 MED ORDER — SYNJARDY 12.5-1000 MG PO TABS: 1.0000 | ORAL_TABLET | Freq: Two times a day (BID) | ORAL | 2 refills | Status: DC

## 2021-03-01 ENCOUNTER — Encounter: Payer: Self-pay | Admitting: Physician Assistant

## 2021-03-01 DIAGNOSIS — N521 Erectile dysfunction due to diseases classified elsewhere: Secondary | ICD-10-CM

## 2021-03-02 MED ORDER — SILDENAFIL CITRATE 100 MG PO TABS: 100.0000 mg | ORAL_TABLET | Freq: Every day | ORAL | 5 refills | Status: DC | PRN

## 2021-03-14 ENCOUNTER — Other Ambulatory Visit: Payer: Self-pay | Admitting: Physician Assistant

## 2021-03-14 DIAGNOSIS — R1013 Epigastric pain: Secondary | ICD-10-CM

## 2021-03-20 ENCOUNTER — Other Ambulatory Visit: Payer: Self-pay | Admitting: Physician Assistant

## 2021-03-20 DIAGNOSIS — E039 Hypothyroidism, unspecified: Secondary | ICD-10-CM

## 2021-03-23 ENCOUNTER — Other Ambulatory Visit: Payer: Self-pay | Admitting: Physician Assistant

## 2021-03-23 DIAGNOSIS — E1165 Type 2 diabetes mellitus with hyperglycemia: Secondary | ICD-10-CM

## 2021-03-24 ENCOUNTER — Other Ambulatory Visit: Payer: Self-pay

## 2021-03-24 ENCOUNTER — Other Ambulatory Visit: Payer: Self-pay | Admitting: Physician Assistant

## 2021-03-24 ENCOUNTER — Ambulatory Visit: Payer: 59 | Admitting: Physician Assistant

## 2021-03-24 VITALS — BP 125/82 | HR 104 | Ht 73.0 in | Wt 220.0 lb

## 2021-03-24 DIAGNOSIS — K2100 Gastro-esophageal reflux disease with esophagitis, without bleeding: Secondary | ICD-10-CM | POA: Diagnosis not present

## 2021-03-24 DIAGNOSIS — R944 Abnormal results of kidney function studies: Secondary | ICD-10-CM

## 2021-03-24 DIAGNOSIS — F339 Major depressive disorder, recurrent, unspecified: Secondary | ICD-10-CM

## 2021-03-24 DIAGNOSIS — Z23 Encounter for immunization: Secondary | ICD-10-CM

## 2021-03-24 DIAGNOSIS — R1013 Epigastric pain: Secondary | ICD-10-CM

## 2021-03-24 DIAGNOSIS — F411 Generalized anxiety disorder: Secondary | ICD-10-CM

## 2021-03-24 DIAGNOSIS — E118 Type 2 diabetes mellitus with unspecified complications: Secondary | ICD-10-CM

## 2021-03-24 DIAGNOSIS — E039 Hypothyroidism, unspecified: Secondary | ICD-10-CM

## 2021-03-24 DIAGNOSIS — E1165 Type 2 diabetes mellitus with hyperglycemia: Secondary | ICD-10-CM | POA: Diagnosis not present

## 2021-03-24 NOTE — Progress Notes (Signed)
Subjective:    Patient ID: Austin Weeks, male    DOB: 03/27/1976, 45 y.o.   MRN: [id number]  HPI Pt is a 45 year old male with type 2 diabetes, hypothyroidism, GERD and epigastric pain who presents to the clinic for 1 month follow up.   He had not been on any medication due to insurance and just got back on.   He is doing much better. He is checking sugars and running 97-117 in am and 130 2 hours after eating.   He is back on GERD, thyroid, mood medication all symptoms are improving.   He continues to have epigastric pain. No vomiting. Some nausea. Denies any melena or hematochezia. Taking protonix daily.   .. Active Ambulatory Problems    Diagnosis Date Noted   Hypothyroidism 01/06/2010   DM 01/06/2010   Hyperlipidemia associated with type 2 diabetes mellitus (HCC) 01/06/2010   OBESITY, UNSPECIFIED 05/15/2009   DEPRESSIVE DISORDER NOT ELSEWHERE CLASSIFIED 05/15/2009   Essential hypertension 01/27/2010   FATIGUE 01/05/2010   WEIGHT GAIN 05/15/2009   SHORTNESS OF BREATH 01/05/2010   CHEST PAIN, LEFT 01/05/2010   TOBACCO USE, QUIT 05/15/2009   Generalized anxiety disorder 04/28/2015   Type 2 diabetes mellitus with complication (HCC) 04/28/2015   Other specified hypothyroidism 04/28/2015   Depression 05/01/2015   Tooth pain 10/24/2015   Poor dentition 05/04/2016   GAD (generalized anxiety disorder) 06/06/2017   Depression, recurrent (HCC) 06/06/2017   Erectile dysfunction due to diseases classified elsewhere 06/06/2017   Right inguinal hernia 12/27/2017   Stress at home 03/29/2019   Eosinophils increased 04/24/2019   Acute recurrent pancreatitis 04/24/2019   Gastroesophageal reflux disease with esophagitis 03/24/2021   Uncontrolled type 2 diabetes mellitus with hyperglycemia (HCC) 03/25/2021   Decreased GFR 03/25/2021   Epigastric pain 03/25/2021   Resolved Ambulatory Problems    Diagnosis Date Noted   No Resolved Ambulatory Problems   Past Medical History:   Diagnosis Date   Depressed    Diabetes mellitus without complication (HCC)    Heart murmur    Hypertension      Review of Systems See HPI.     Objective:   Physical Exam Vitals reviewed.  Constitutional:      Appearance: Normal appearance.  HENT:     Head: Normocephalic.  Neck:     Vascular: No carotid bruit.  Cardiovascular:     Rate and Rhythm: Normal rate and regular rhythm.     Pulses: Normal pulses.     Heart sounds: Normal heart sounds.  Pulmonary:     Effort: Pulmonary effort is normal.     Breath sounds: Normal breath sounds.  Abdominal:     General: Bowel sounds are normal. There is no distension.     Palpations: Abdomen is soft. There is no mass.     Tenderness: There is abdominal tenderness. There is no right CVA tenderness, left CVA tenderness, guarding or rebound.     Comments: Epigastric tenderness to palpation.   Neurological:     General: No focal deficit present.     Mental Status: He is alert and oriented to person, place, and time.  Psychiatric:        Mood and Affect: Mood normal.         .. Lab Results  Component Value Date   HGBA1C 14.0 (A) 02/24/2021    Assessment & Plan:  Marland KitchenMarland KitchenKayo was seen today for follow-up.  Diagnoses and all orders for this visit:  Uncontrolled type 2 diabetes mellitus  with hyperglycemia (HCC) -     COMPLETE METABOLIC PANEL WITH GFR  Flu vaccine need -     Flu Vaccine QUAD 53mo+IM (Fluarix, Fluzone & Alfiuria Quad PF)  Hypothyroidism, unspecified type -     TSH -     COMPLETE METABOLIC PANEL WITH GFR  Gastroesophageal reflux disease with esophagitis, unspecified whether hemorrhage -     Ambulatory referral to Gastroenterology  Decreased GFR -     COMPLETE METABOLIC PANEL WITH GFR  Epigastric pain -     Ambulatory referral to Gastroenterology  Based on home sugars DM is doing [facility] better. He is on all medications and affordable.  Recheck A1C in 2 months.   Vitals look good.   Decreased GFR and  uncontrolled hypothyroidism.  Recheck CMP/TSH.   Continue protonix which is helping. Referral to GI due to persist epigastric pain. CT recent no masses or acute findings. Encouraged GERD diet.  No red flag signs or symptoms on labs or PE.

## 2021-03-25 ENCOUNTER — Encounter: Payer: Self-pay | Admitting: Physician Assistant

## 2021-03-25 ENCOUNTER — Other Ambulatory Visit: Payer: Self-pay | Admitting: Physician Assistant

## 2021-03-25 DIAGNOSIS — E1165 Type 2 diabetes mellitus with hyperglycemia: Secondary | ICD-10-CM | POA: Insufficient documentation

## 2021-03-25 DIAGNOSIS — R1013 Epigastric pain: Secondary | ICD-10-CM | POA: Insufficient documentation

## 2021-03-25 DIAGNOSIS — R944 Abnormal results of kidney function studies: Secondary | ICD-10-CM | POA: Insufficient documentation

## 2021-03-25 LAB — COMPLETE METABOLIC PANEL WITH GFR
AG Ratio: 2 (calc) (ref 1.0–2.5)
ALT: 25 U/L (ref 9–46)
AST: 24 U/L (ref 10–40)
Albumin: 4.8 g/dL (ref 3.6–5.1)
Alkaline phosphatase (APISO): 77 U/L (ref 36–130)
BUN/Creatinine Ratio: 12 (calc) (ref 6–22)
BUN: 17 mg/dL (ref 7–25)
CO2: 27 mmol/L (ref 20–32)
Calcium: 9.6 mg/dL (ref 8.6–10.3)
Chloride: 106 mmol/L (ref 98–110)
Creat: 1.43 mg/dL — ABNORMAL HIGH (ref 0.60–1.29)
Globulin: 2.4 g/dL (calc) (ref 1.9–3.7)
Glucose, Bld: 85 mg/dL (ref 65–99)
Potassium: 4.3 mmol/L (ref 3.5–5.3)
Sodium: 141 mmol/L (ref 135–146)
Total Bilirubin: 0.9 mg/dL (ref 0.2–1.2)
Total Protein: 7.2 g/dL (ref 6.1–8.1)
eGFR: 63 mL/min/{1.73_m2} (ref >=60)

## 2021-03-25 LAB — TSH: TSH: 129.93 mIU/L — ABNORMAL HIGH (ref 0.40–4.50)

## 2021-03-25 MED ORDER — LEVOTHYROXINE SODIUM 150 MCG PO TABS: 150.0000 ug | ORAL_TABLET | Freq: Every day | ORAL | 0 refills | Status: DC

## 2021-03-25 MED ORDER — VILAZODONE HCL 20 MG PO TABS
1.0000 | ORAL_TABLET | Freq: Every day | ORAL | 1 refills | Status: DC
Start: 2021-03-25 — End: 2021-09-15

## 2021-03-25 NOTE — Progress Notes (Signed)
Austin Weeks,   Things are improving. Sodium, calcium, liver look great.  Kidney function improved.  Your TSH is not a detectable level but not to goal. Increased levothyroxine and lab only recheck in 3 months.

## 2021-05-01 ENCOUNTER — Other Ambulatory Visit: Payer: Self-pay | Admitting: Physician Assistant

## 2021-05-01 DIAGNOSIS — E039 Hypothyroidism, unspecified: Secondary | ICD-10-CM

## 2021-05-05 ENCOUNTER — Other Ambulatory Visit: Payer: Self-pay

## 2021-05-05 ENCOUNTER — Encounter: Payer: Self-pay | Admitting: Gastroenterology

## 2021-05-05 ENCOUNTER — Ambulatory Visit (INDEPENDENT_AMBULATORY_CARE_PROVIDER_SITE_OTHER): Payer: 59 | Admitting: Gastroenterology

## 2021-05-05 VITALS — BP 118/80 | HR 95 | Ht 73.0 in | Wt 214.2 lb

## 2021-05-05 DIAGNOSIS — R1012 Left upper quadrant pain: Secondary | ICD-10-CM

## 2021-05-05 DIAGNOSIS — R1013 Epigastric pain: Secondary | ICD-10-CM

## 2021-05-05 DIAGNOSIS — R195 Other fecal abnormalities: Secondary | ICD-10-CM | POA: Diagnosis not present

## 2021-05-05 NOTE — Patient Instructions (Signed)
If you are age 43 or older, your body mass index should be between 23-30. Your Body mass index is 28.27 kg/m. If this is out of the aforementioned range listed, please consider follow up with your Primary Care Provider.  If you are age 57 or younger, your body mass index should be between 19-25. Your Body mass index is 28.27 kg/m. If this is out of the aformentioned range listed, please consider follow up with your Primary Care Provider.   __________________________________________________________  The [place] [facility] providers would like to encourage you to use [facility] to communicate with providers for non-urgent requests or questions.  Due to long hold times on the telephone, sending your provider a message by [facility] may be a faster and more efficient way to get a response.  Please allow 48 business hours for a response.  Please remember that this is for non-urgent requests.   Due to recent changes in healthcare laws, you may see the results of your imaging and laboratory studies on MyChart before your provider has had a chance to review them.  We understand that in some cases there may be results that are confusing or concerning to you. Not all laboratory results come back in the same time frame and the provider may be waiting for multiple results in order to interpret others.  Please give [place] 48 hours in order for your provider to thoroughly review all the results before contacting the office for clarification of your results.   Thank you for choosing me and [facility].  Vito Cirigliano, D.O.

## 2021-05-05 NOTE — Progress Notes (Signed)
Chief Complaint: GERD, MEG pain  Referring Provider:     Jomarie Longs, PA-C  HPI:    Austin Weeks is a 45 y.o. male with a history of HTN, hypothyroidism, CKD, diabetes, ccy, referred to the Gastroenterology Clinic for evaluation of reflux sxs and MEG pain.   Reports 4-34-month history of constant epigastric/LUQ pain and substernal pain, with episodes of pain radiating to b/l shoulder blades.  Occasional nausea, but no emesis. Not a/w PO intake, time of day, activity, etc.   Was seen in the ER for this issue on 12/29/2020.   - Aside from creatinine 1.3 (baseline ~1.3-1.4), labs largely unremarkable.   - CT abdomen/pelvis L>R perinephric edema, but otherwise unremarkable, to include normal liver, pancreas, GI tract. - ECG reported as normal/unremarkable.   - Discharged home with Prilosec 20 mg/day without much improvement.   -Labs from 02/2021 reviewed: Creatinine 1.43, otherwise normal CMP.  TSH 130. Normal CBC, lipase. Negative H pylori breath test. Increased synthroid  Recently started Protonix 40 mg/day and famotidine BID with some improvement, but not resolution.  Does have intermittent occasional fecal urgency and loose stools over last 5 months or so, but tends to be dietary associated.  No previous EGD or colonoscopy.   No known family history of CRC, GI malignancy, liver disease, pancreatic disease, or IBD.     Past Medical History:  Diagnosis Date   Depressed    Diabetes mellitus without complication (HCC)    Heart murmur    as a child   Hypertension    Hypothyroidism      Past Surgical History:  Procedure Laterality Date   CHOLECYSTECTOMY     Family History  Problem Relation Age of Onset   Depression Mother    Diabetes Mother    Hypertension Mother    Hyperlipidemia Mother    Cancer Maternal Grandmother    Coronary artery disease Maternal Grandfather    Heart attack Maternal Grandfather    Alcohol abuse Paternal Grandfather    Stroke  Paternal Grandfather    Diabetes Maternal Aunt    Diabetes Maternal Aunt    Sleep apnea Maternal Uncle    Alcohol abuse Maternal Uncle    Diabetes Maternal Uncle    Alcohol abuse Paternal Uncle    Colon cancer Neg Hx    Rectal cancer Neg Hx    Pancreatic cancer Neg Hx    Esophageal cancer Neg Hx    Liver cancer Neg Hx    Social History   Tobacco Use   Smoking status: Former   Smokeless tobacco: Never   Tobacco comments:    quit Jan 2010   Vaping Use   Vaping Use: Every day  Substance Use Topics   Alcohol use: Not Currently    Comment: 3 etoh/week   Drug use: No   Current Outpatient Medications  Medication Sig Dispense Refill   ACCU-CHEK GUIDE test strip USE AS DIRECTED 100 strip 1   AMBULATORY NON FORMULARY MEDICATION One touch ultra 2  Testing 2 twice a day. For Diabetes Mellitus type II, uncontrolled. 100 strip 5   atorvastatin (LIPITOR) 40 MG tablet Take 1 tablet (40 mg total) by mouth daily at 6 PM. 90 tablet 3   Continuous Blood Gluc Sensor (FREESTYLE LIBRE 14 DAY SENSOR) MISC 1 application by Does not apply route every 14 (fourteen) days. Apply upper deltoid every 14 days, use reader to determine blood sugars 2  each 11   Empagliflozin-metFORMIN HCl (SYNJARDY) 12.11-998 MG TABS Take 1 tablet by mouth 2 (two) times daily. 60 tablet 2   glipiZIDE (GLUCOTROL) 5 MG tablet Take 1 tablet (5 mg total) by mouth 2 (two) times daily before a meal. 180 tablet 0   insulin glargine, 1 Unit Dial, (TOUJEO SOLOSTAR) 300 UNIT/ML Solostar Pen Inject 26 Units into the skin at bedtime. 4.5 mL 1   levothyroxine (SYNTHROID) 150 MCG tablet Take 1 tablet (150 mcg total) by mouth daily. 90 tablet 0   lisinopril (ZESTRIL) 20 MG tablet Take 1 tablet (20 mg total) by mouth daily. 90 tablet 0   Monolet Lancets MISC by Does not apply route as directed.     pantoprazole (PROTONIX) 40 MG tablet TAKE 1 TABLET BY MOUTH EVERY DAY 90 tablet 0   sildenafil (VIAGRA) 100 MG tablet Take 1 tablet (100 mg  total) by mouth daily as needed for erectile dysfunction. 10 tablet 5   Vilazodone HCl (VIIBRYD) 20 MG TABS Take 1 tablet (20 mg total) by mouth daily. 90 tablet 1   No current facility-administered medications for this visit.   Allergies  Allergen Reactions   Ozempic (0.25 Or 0.5 Mg-Dose) [Semaglutide(0.25 Or 0.5mg -Dos)]     Nausea/epigastric pain-mild pancreatitis     Review of Systems: All systems reviewed and negative except where noted in HPI.     Physical Exam:    Wt Readings from Last 3 Encounters:  05/05/21 214 lb 4 oz (97.2 kg)  03/24/21 220 lb (99.8 kg)  02/24/21 230 lb (104.3 kg)    BP 118/80   Pulse 95   Ht 6\' 1"  (1.854 m)   Wt 214 lb 4 oz (97.2 kg)   SpO2 98%   BMI 28.27 kg/m  Constitutional:  Pleasant, in no acute distress. Psychiatric: Normal mood and affect. Behavior is normal. Cardiovascular: Normal rate, regular rhythm. No edema Pulmonary/chest: Effort normal and breath sounds normal. No wheezing, rales or rhonchi. Abdominal: TTP in epigastrium without rebound or guarding.  TTP along  b/l costal margins.  Soft, nondistended.  Bowel sounds active throughout. There are no masses palpable. No hepatomegaly. Neurological: Alert and oriented to person place and time. Skin: Skin is warm and dry. No rashes noted.   ASSESSMENT AND PLAN;   1) Epigastric pain 2) LUQ pain While exam does have component of costochondritis, the duration of symptoms and epigastric TTP along the substernal pain are suggestive of GI mucosal/luminal pathology, with plan for the following: - EGD to evaluate for PUD, gastritis, H. pylori, erosive esophagitis, etc. - Continue Protonix and famotidine as currently prescribed - If EGD unrevealing, plan for trial of NSAIDs for possible superimposed costochondritis  3) Loose stools - Intermittent loose stools which seem dietary related by patient description - Increase dietary fiber - Conservative management for now - If symptoms more  bothersome, escalating, etc., plan for further work-up to include labs, stool studies, colonoscopy  The indications, risks, and benefits of EGD were explained to the patient in detail. Risks include but are not limited to bleeding, perforation, adverse reaction to medications, and cardiopulmonary compromise. Sequelae include but are not limited to the possibility of surgery, hospitalization, and mortality. The patient verbalized understanding and wished to proceed. All questions answered, referred to scheduler. Further recommendations pending results of the exam.     Brindle Leyba, DO, FACG  05/05/2021, 1:32 PM   Breeback, Jade L, PA-C

## 2021-05-06 ENCOUNTER — Encounter: Payer: Self-pay | Admitting: Gastroenterology

## 2021-05-07 ENCOUNTER — Ambulatory Visit (AMBULATORY_SURGERY_CENTER): Payer: 59 | Admitting: Gastroenterology

## 2021-05-07 ENCOUNTER — Encounter: Payer: Self-pay | Admitting: Gastroenterology

## 2021-05-07 ENCOUNTER — Other Ambulatory Visit: Payer: Self-pay

## 2021-05-07 VITALS — BP 116/75 | HR 68 | Temp 97.8°F | Resp 11 | Ht 73.0 in | Wt 214.0 lb

## 2021-05-07 DIAGNOSIS — K3189 Other diseases of stomach and duodenum: Secondary | ICD-10-CM | POA: Diagnosis not present

## 2021-05-07 DIAGNOSIS — K2980 Duodenitis without bleeding: Secondary | ICD-10-CM

## 2021-05-07 DIAGNOSIS — K2970 Gastritis, unspecified, without bleeding: Secondary | ICD-10-CM | POA: Diagnosis not present

## 2021-05-07 DIAGNOSIS — R1013 Epigastric pain: Secondary | ICD-10-CM

## 2021-05-07 DIAGNOSIS — R1012 Left upper quadrant pain: Secondary | ICD-10-CM | POA: Diagnosis not present

## 2021-05-07 MED ORDER — PANTOPRAZOLE SODIUM 40 MG PO TBEC: 40.0000 mg | DELAYED_RELEASE_TABLET | Freq: Every day | ORAL | 0 refills | Status: DC

## 2021-05-07 MED ORDER — SODIUM CHLORIDE 0.9 % IV SOLN: 500.0000 mL | Freq: Once | INTRAVENOUS | Status: DC

## 2021-05-07 MED ORDER — PANTOPRAZOLE SODIUM 40 MG PO TBEC: 40.0000 mg | DELAYED_RELEASE_TABLET | Freq: Every day | ORAL | 3 refills | Status: DC

## 2021-05-07 NOTE — Progress Notes (Signed)
Called to room to assist during endoscopic procedure.  Patient ID and intended procedure confirmed with present staff. Received instructions for my participation in the procedure from the performing physician.  

## 2021-05-07 NOTE — Op Note (Signed)
[facility] Patient Name: Austin Weeks Procedure Date: 05/07/2021 1:07 PM MRN: [id number] Endoscopist: Doristine Locks , MD Age: 45 Referring MD:  Date of Birth: [birth year] Gender: Male Account #: [id number] Procedure:                Upper GI endoscopy Indications:              Epigastric abdominal pain, Abdominal pain in the                            left upper quadrant Medicines:                Monitored Anesthesia Care Procedure:                Pre-Anesthesia Assessment:                           - Prior to the procedure, a History and Physical                            was performed, and patient medications and                            allergies were reviewed. The patient's tolerance of                            previous anesthesia was also reviewed. The risks                            and benefits of the procedure and the sedation                            options and risks were discussed with the patient.                            All questions were answered, and informed consent                            was obtained. Prior Anticoagulants: The patient has                            taken no previous anticoagulant or antiplatelet                            agents. ASA Grade Assessment: II - A patient with                            mild systemic disease. After reviewing the risks                            and benefits, the patient was deemed in                            satisfactory condition to undergo the procedure.  After obtaining informed consent, the endoscope was                            passed under direct vision. Throughout the                            procedure, the patient's blood pressure, pulse, and                            oxygen saturations were monitored continuously. The                            GIF [id number] #[id number] was introduced through the                            mouth, and advanced to the second part of  duodenum.                            The upper GI endoscopy was accomplished without                            difficulty. The patient tolerated the procedure                            well. Scope In: Scope Out: Findings:                 The examined esophagus was normal.                           The Z-line was regular and was found 40 cm from the                            incisors.                           The gastroesophageal flap valve was visualized                            endoscopically and classified as Hill Grade II                            (fold present, opens with respiration).                           The entire examined stomach was normal. Biopsies                            were taken with a cold forceps for Helicobacter                            pylori testing. Estimated blood loss was minimal.                           Localized mildly erythematous mucosa without active  bleeding and with no stigmata of bleeding was found                            in the duodenal bulb. Biopsies were taken with a                            cold forceps for histology. Estimated blood loss                            was minimal.                           The second portion of the duodenum was normal.                            Biopsies were taken with a cold forceps for                            histology. Estimated blood loss was minimal. Complications:            No immediate complications. Estimated Blood Loss:     Estimated blood loss was minimal. Impression:               - Normal esophagus.                           - Z-line regular, 40 cm from the incisors.                           - Gastroesophageal flap valve classified as Hill                            Grade II (fold present, opens with respiration).                           - Normal stomach. Biopsied.                           - Erythematous duodenopathy. Biopsied.                            - Normal second portion of the duodenum. Biopsied. Recommendation:           - Patient has a contact number available for                            emergencies. The signs and symptoms of potential                            delayed complications were discussed with the                            patient. Return to normal activities tomorrow.                            Written discharge instructions  were provided to the                            patient.                           - Resume previous diet.                           - Continue present medications.                           - Await pathology results.                           - Return to GI clinic PRN. Doristine Locks, MD 05/07/2021 1:48:46 PM

## 2021-05-07 NOTE — Progress Notes (Signed)
Pt in recovery with monitors in place, VSS. Report given to receiving RN. Bite guard was placed with pt awake to ensure comfort. No dental or soft tissue damage noted. 

## 2021-05-07 NOTE — Patient Instructions (Signed)
Resume previous diet Continue current medications Await pathology results  YOU HAD AN ENDOSCOPIC PROCEDURE TODAY AT THE [facility]:   Refer to the procedure report that was given to you for any specific questions about what was found during the examination.  If the procedure report does not answer your questions, please call your gastroenterologist to clarify.  If you requested that your care partner not be given the details of your procedure findings, then the procedure report has been included in a sealed envelope for you to review at your convenience later.  YOU SHOULD EXPECT: Some feelings of bloating in the abdomen. Passage of more gas than usual.  Walking can help get rid of the air that was put into your GI tract during the procedure and reduce the bloating. If you had a lower endoscopy (such as a colonoscopy or flexible sigmoidoscopy) you may notice spotting of blood in your stool or on the toilet paper. If you underwent a bowel prep for your procedure, you may not have a normal bowel movement for a few days.  Please Note:  You might notice some irritation and congestion in your nose or some drainage.  This is from the oxygen used during your procedure.  There is no need for concern and it should clear up in a day or so.  SYMPTOMS TO REPORT IMMEDIATELY:   Following upper endoscopy (EGD)  Vomiting of blood or coffee ground material  New chest pain or pain under the shoulder blades  Painful or persistently difficult swallowing  New shortness of breath  Fever of 100F or higher  Black, tarry-looking stools  For urgent or emergent issues, a gastroenterologist can be reached at any hour by calling [phone]. Do not use MyChart messaging for urgent concerns.    DIET:  We do recommend a small meal at first, but then you may proceed to your regular diet.  Drink plenty of fluids but you should avoid alcoholic beverages for 24 hours.  ACTIVITY:  You should plan to take it  easy for the rest of today and you should NOT DRIVE or use heavy machinery until tomorrow (because of the sedation medicines used during the test).    FOLLOW UP: Our staff will call the number listed on your records 48-72 hours following your procedure to check on you and address any questions or concerns that you may have regarding the information given to you following your procedure. If we do not reach you, we will leave a message.  We will attempt to reach you two times.  During this call, we will ask if you have developed any symptoms of COVID 19. If you develop any symptoms (ie: fever, flu-like symptoms, shortness of breath, cough etc.) before then, please call [phone].  If you test positive for Covid 19 in the 2 weeks post procedure, please call and report this information to us.    If any biopsies were taken you will be contacted by phone or by letter within the next 1-3 weeks.  Please call us at [phone] if you have not heard about the biopsies in 3 weeks.    SIGNATURES/CONFIDENTIALITY: You and/or your care partner have signed paperwork which will be entered into your electronic medical record.  These signatures attest to the fact that that the information above on your After Visit Summary has been reviewed and is understood.  Full responsibility of the confidentiality of this discharge information lies with you and/or your care-partner. 

## 2021-05-07 NOTE — Progress Notes (Signed)
GASTROENTEROLOGY PROCEDURE H&P NOTE   Primary Care Physician: Jomarie Longs, PA-C    Reason for Procedure:   GERD, MEG pain, LUQ Pain  Plan:    EGD  Patient is appropriate for endoscopic procedure(s) in the ambulatory ([facility]) setting.  The nature of the procedure, as well as the risks, benefits, and alternatives were carefully and thoroughly reviewed with the patient. Ample time for discussion and questions allowed. The patient understood, was satisfied, and agreed to proceed.     HPI: Austin Weeks is a 45 y.o. male who presents for EGD for evaluation of MEG pain, LUQ pain, and suspected reflux sxs.  Patient was most recently seen in the Gastroenterology Clinic on 05/05/2021.  No interval change in medical history since that appointment. Please refer to that note for full details regarding GI history and clinical presentation.   Past Medical History:  Diagnosis Date   Depressed    Diabetes mellitus without complication (HCC)    Heart murmur    as a child   Hypertension    Hypothyroidism     Past Surgical History:  Procedure Laterality Date   CHOLECYSTECTOMY      Prior to Admission medications   Medication Sig Start Date End Date Taking? Authorizing Provider  atorvastatin (LIPITOR) 40 MG tablet Take 1 tablet (40 mg total) by mouth daily at 6 PM. 02/24/21  Yes Breeback, Jade L, PA-C  Empagliflozin-metFORMIN HCl (SYNJARDY) 12.11-998 MG TABS Take 1 tablet by mouth 2 (two) times daily. 02/27/21  Yes Breeback, Jade L, PA-C  glipiZIDE (GLUCOTROL) 5 MG tablet Take 1 tablet (5 mg total) by mouth 2 (two) times daily before a meal. 02/24/21  Yes Breeback, Jade L, PA-C  insulin glargine, 1 Unit Dial, (TOUJEO SOLOSTAR) 300 UNIT/ML Solostar Pen Inject 26 Units into the skin at bedtime. 02/27/21  Yes Breeback, Jade L, PA-C  levothyroxine (SYNTHROID) 150 MCG tablet Take 1 tablet (150 mcg total) by mouth daily. 03/25/21  Yes Breeback, Jade L, PA-C  lisinopril (ZESTRIL) 20 MG tablet Take 1  tablet (20 mg total) by mouth daily. 02/24/21  Yes Breeback, Jade L, PA-C  pantoprazole (PROTONIX) 40 MG tablet TAKE 1 TABLET BY MOUTH EVERY DAY 03/16/21  Yes Breeback, Jade L, PA-C  Vilazodone HCl (VIIBRYD) 20 MG TABS Take 1 tablet (20 mg total) by mouth daily. 03/25/21  Yes Breeback, Jade L, PA-C  ACCU-CHEK GUIDE test strip USE AS DIRECTED 09/20/19   Breeback, Jade L, PA-C  AMBULATORY NON FORMULARY MEDICATION One touch ultra 2  Testing 2 twice a day. For Diabetes Mellitus type II, uncontrolled. 03/26/19   Breeback, Jade L, PA-C  Continuous Blood Gluc Sensor (FREESTYLE LIBRE 14 DAY SENSOR) MISC 1 application by Does not apply route every 14 (fourteen) days. Apply upper deltoid every 14 days, use reader to determine blood sugars Patient not taking: Reported on 05/07/2021 02/24/21   Jomarie Longs, PA-C  Monolet Lancets MISC by Does not apply route as directed.    [provider]  sildenafil (VIAGRA) 100 MG tablet Take 1 tablet (100 mg total) by mouth daily as needed for erectile dysfunction. 03/02/21   Jomarie Longs, PA-C    Current Outpatient Medications  Medication Sig Dispense Refill   atorvastatin (LIPITOR) 40 MG tablet Take 1 tablet (40 mg total) by mouth daily at 6 PM. 90 tablet 3   Empagliflozin-metFORMIN HCl (SYNJARDY) 12.11-998 MG TABS Take 1 tablet by mouth 2 (two) times daily. 60 tablet 2   glipiZIDE (GLUCOTROL)  5 MG tablet Take 1 tablet (5 mg total) by mouth 2 (two) times daily before a meal. 180 tablet 0   insulin glargine, 1 Unit Dial, (TOUJEO SOLOSTAR) 300 UNIT/ML Solostar Pen Inject 26 Units into the skin at bedtime. 4.5 mL 1   levothyroxine (SYNTHROID) 150 MCG tablet Take 1 tablet (150 mcg total) by mouth daily. 90 tablet 0   lisinopril (ZESTRIL) 20 MG tablet Take 1 tablet (20 mg total) by mouth daily. 90 tablet 0   pantoprazole (PROTONIX) 40 MG tablet TAKE 1 TABLET BY MOUTH EVERY DAY 90 tablet 0   Vilazodone HCl (VIIBRYD) 20 MG TABS Take 1 tablet (20 mg total) by mouth  daily. 90 tablet 1   ACCU-CHEK GUIDE test strip USE AS DIRECTED 100 strip 1   AMBULATORY NON FORMULARY MEDICATION One touch ultra 2  Testing 2 twice a day. For Diabetes Mellitus type II, uncontrolled. 100 strip 5   Continuous Blood Gluc Sensor (FREESTYLE LIBRE 14 DAY SENSOR) MISC 1 application by Does not apply route every 14 (fourteen) days. Apply upper deltoid every 14 days, use reader to determine blood sugars (Patient not taking: Reported on 05/07/2021) 2 each 11   Monolet Lancets MISC by Does not apply route as directed.     sildenafil (VIAGRA) 100 MG tablet Take 1 tablet (100 mg total) by mouth daily as needed for erectile dysfunction. 10 tablet 5   Current Facility-Administered Medications  Medication Dose Route Frequency Provider Last Rate Last Admin   0.9 %  sodium chloride infusion  500 mL Intravenous Once Mazy Culton V, DO        Allergies as of 05/07/2021 - Review Complete 05/07/2021  Allergen Reaction Noted   Ozempic (0.25 or 0.5 mg-dose) [semaglutide(0.25 or 0.5mg -dos)]  04/23/2019    Family History  Problem Relation Age of Onset   Depression Mother    Diabetes Mother    Hypertension Mother    Hyperlipidemia Mother    Cancer Maternal Grandmother    Coronary artery disease Maternal Grandfather    Heart attack Maternal Grandfather    Alcohol abuse Paternal Grandfather    Stroke Paternal Grandfather    Diabetes Maternal Aunt    Diabetes Maternal Aunt    Sleep apnea Maternal Uncle    Alcohol abuse Maternal Uncle    Diabetes Maternal Uncle    Alcohol abuse Paternal Uncle    Colon cancer Neg Hx    Rectal cancer Neg Hx    Pancreatic cancer Neg Hx    Esophageal cancer Neg Hx    Liver cancer Neg Hx     Social History   Socioeconomic History   Marital status: Married    Spouse name: Not on file   Number of children: 3   Years of education: Not on file   Highest education level: Not on file  Occupational History   Not on file  Tobacco Use   Smoking  status: Former   Smokeless tobacco: Never   Tobacco comments:    quit Jan 2010   Vaping Use   Vaping Use: Every day  Substance and Sexual Activity   Alcohol use: Not Currently    Comment: 3 etoh/week   Drug use: No   Sexual activity: Yes  Other Topics Concern   Not on file  Social History Narrative   Works out 3 days/wk.    Social Determinants of Health   Financial Resource Strain: Not on file  Food Insecurity: Not on file  Transportation Needs: Not on  file  Physical Activity: Not on file  Stress: Not on file  Social Connections: Not on file  Intimate Partner Violence: Not on file    Physical Exam: Vital signs in last 24 hours: @BP  108/67   Pulse 100   Temp 97.8 F (36.6 C) (Skin)   Ht 6\' 1"  (1.854 m)   Wt 214 lb (97.1 kg)   SpO2 98%   BMI 28.23 kg/m  GEN: NAD EYE: Sclerae anicteric ENT: MMM CV: Non-tachycardic Pulm: CTA b/l GI: Soft, NT/ND NEURO:  Alert & Oriented x 3   , DO [facility]   05/07/2021 1:28 PM

## 2021-05-07 NOTE — Progress Notes (Signed)
Pt's states no medical or surgical changes since previsit or office visit. VS assessed by D.T 

## 2021-05-11 ENCOUNTER — Telehealth: Payer: Self-pay | Admitting: *Deleted

## 2021-05-11 NOTE — Telephone Encounter (Signed)
  Follow up Call-  Call back number 05/07/2021  Post procedure Call Back phone  # [phone]  Permission to leave phone message Yes  Some recent data might be hidden     Patient questions:  Do you have a fever, pain , or abdominal swelling? No. Pain Score  0 *  Have you tolerated food without any problems? Yes.    Have you been able to return to your normal activities? Yes.    Do you have any questions about your discharge instructions: Diet   No. Medications  No. Follow up visit  No.  Do you have questions or concerns about your Care? No.  Actions: * If pain score is 4 or above: No action needed, pain <4.  Have you developed a fever since your procedure? no  2.   Have you had an respiratory symptoms (SOB or cough) since your procedure? no  3.   Have you tested positive for COVID 19 since your procedure no  4.   Have you had any family members/close contacts diagnosed with the COVID 19 since your procedure?  no   If yes to any of these questions please route to Laverna Peace, RN and Karlton Lemon, RN

## 2021-05-20 ENCOUNTER — Telehealth: Payer: Self-pay | Admitting: General Surgery

## 2021-05-20 DIAGNOSIS — R1013 Epigastric pain: Secondary | ICD-10-CM

## 2021-05-20 DIAGNOSIS — R195 Other fecal abnormalities: Secondary | ICD-10-CM

## 2021-05-20 DIAGNOSIS — K900 Celiac disease: Secondary | ICD-10-CM

## 2021-05-20 DIAGNOSIS — K2100 Gastro-esophageal reflux disease with esophagitis, without bleeding: Secondary | ICD-10-CM

## 2021-05-20 NOTE — Telephone Encounter (Signed)
-----   Message from [facility] V, DO sent at 05/20/2021  7:35 AM EDT ----- The biopsies from the recent upper endoscopy were notable for villous atrophy and increased intraepithelial lymphocytes on small bowel biopsies.  These findings are typical of Celiac Disease.    The biopsies from the stomach demonstrate mild inflammation (gastritis), but no Helicobacter pylori infection or other worrisome features.  I recommend the following:  - Check tTG IgA, total IgA, anti-D emanated gliadin peptide IgA and IgG - Do not initiate gluten-free diet until after completion of lab work - Referral to Nutrition Clinic for assistance in initiating gluten-free diet - Schedule follow-up appointment in the GI clinic

## 2021-05-20 NOTE — Telephone Encounter (Signed)
Left a detailed message regarding his EGD results. Expressed we need [facility] to [facility] and we initiated a nutrition referral for Celiac diet. Pt will also need to schedule a return visit to our clinic.

## 2021-05-29 ENCOUNTER — Encounter: Payer: Self-pay | Admitting: Physician Assistant

## 2021-05-29 ENCOUNTER — Other Ambulatory Visit: Payer: Self-pay

## 2021-05-29 ENCOUNTER — Ambulatory Visit: Payer: 59 | Admitting: Physician Assistant

## 2021-05-29 VITALS — BP 128/73 | HR 88 | Ht 73.0 in | Wt 214.0 lb

## 2021-05-29 DIAGNOSIS — E039 Hypothyroidism, unspecified: Secondary | ICD-10-CM

## 2021-05-29 DIAGNOSIS — E118 Type 2 diabetes mellitus with unspecified complications: Secondary | ICD-10-CM | POA: Diagnosis not present

## 2021-05-29 DIAGNOSIS — G629 Polyneuropathy, unspecified: Secondary | ICD-10-CM

## 2021-05-29 DIAGNOSIS — E1169 Type 2 diabetes mellitus with other specified complication: Secondary | ICD-10-CM

## 2021-05-29 DIAGNOSIS — I10 Essential (primary) hypertension: Secondary | ICD-10-CM | POA: Diagnosis not present

## 2021-05-29 DIAGNOSIS — E1165 Type 2 diabetes mellitus with hyperglycemia: Secondary | ICD-10-CM

## 2021-05-29 DIAGNOSIS — E785 Hyperlipidemia, unspecified: Secondary | ICD-10-CM

## 2021-05-29 LAB — POCT GLYCOSYLATED HEMOGLOBIN (HGB A1C): Hemoglobin A1C: 6.5 % — AB (ref 4.0–5.6)

## 2021-05-29 MED ORDER — GABAPENTIN 100 MG PO CAPS: ORAL_CAPSULE | ORAL | 1 refills | Status: DC

## 2021-05-29 MED ORDER — SYNJARDY 12.5-1000 MG PO TABS: 1.0000 | ORAL_TABLET | Freq: Two times a day (BID) | ORAL | 0 refills | Status: DC

## 2021-05-29 MED ORDER — LISINOPRIL 20 MG PO TABS: 20.0000 mg | ORAL_TABLET | Freq: Every day | ORAL | 0 refills | Status: DC

## 2021-05-29 NOTE — Patient Instructions (Signed)
Costochondritis Costochondritis is irritation and swelling (inflammation) of the tissue that connects the ribs to the breastbone (sternum). This tissue is called cartilage. Costochondritis causes pain in the front of the chest. Usually, the pain: Starts slowly. Is in more than one rib. What are the causes? The exact cause of this condition is not always known. It results from stress on the tissue in the affected area. The cause of this stress could be: Chest injury. Exercise or activity, such as lifting. Very bad coughing. What increases the risk? You are more likely to develop this condition if you: Are male. Are 30-40 years old. Recently started a new exercise or work activity. Have low levels of vitamin D. Have a condition that makes you cough often. What are the signs or symptoms? The main symptom of this condition is chest pain. The pain: Usually starts slowly and can be sharp or dull. Gets worse with deep breathing, coughing, or exercise. Gets better with rest. May be worse when you press on the affected area of your ribs and breastbone. How is this treated? This condition usually goes away on its own over time. Your doctor may prescribe an NSAID, such as ibuprofen. This can help reduce pain and inflammation. Treatment may also include: Resting and avoiding activities that make pain worse. Putting heat or ice on the painful area. Doing exercises to stretch your chest muscles. If these treatments do not help, your doctor may inject a numbing medicine to help relieve the pain. Follow these instructions at home: Managing pain, stiffness, and swelling   If told, put ice on the painful area. To do this: Put ice in a plastic bag. Place a towel between your skin and the bag. Leave the ice on for 20 minutes, 2-3 times a day. If told, put heat on the affected area. Do this as often as told by your doctor. Use the heat source that your doctor recommends, such as a moist heat pack or  a heating pad. Place a towel between your skin and the heat source. Leave the heat on for 20-30 minutes. Take off the heat if your skin turns bright red. This is very important if you cannot feel pain, heat, or cold. You may have a greater risk of getting burned. Activity Rest as told by your doctor. Do not do anything that makes your pain worse. This includes any activities that use chest, belly (abdomen), and side muscles. Do not lift anything that is heavier than 10 lb (4.5 kg), or the limit that you are told, until your doctor says that it is safe. Return to your normal activities as told by your doctor. Ask your doctor what activities are safe for you. General instructions Take over-the-counter and prescription medicines only as told by your doctor. Keep all follow-up visits as told by your doctor. This is important. Contact a doctor if: You have chills or a fever. Your pain does not go away or it gets worse. You have a cough that does not go away. Get help right away if: You are short of breath. You have very bad chest pain that is not helped by medicines, heat, or ice. These symptoms may be an emergency. Do not wait to see if the symptoms will go away. Get medical help right away. Call your local emergency services (911 in the [place] Do not drive yourself to the hospital. Summary Costochondritis is irritation and swelling (inflammation) of the tissue that connects the ribs to the breastbone (sternum). This condition   causes pain in the front of the chest. Treatment may include medicines, rest, heat or ice, and exercises. This information is not intended to replace advice given to you by your health care provider. Make sure you discuss any questions you have with your health care provider. Document Revised: 05/25/2019 Document Reviewed: 05/25/2019 Elsevier Patient Education  2022 Elsevier Inc.  

## 2021-05-29 NOTE — Progress Notes (Signed)
Subjective:    Patient ID: Austin Weeks, male    DOB: 1975/08/12, 45 y.o.   MRN: [id number]  HPI Pt is a 45 yo male with T2DM, HTN, HLD, hypothyroidism, MDD, GAD who presents to the clinic for 3 month follow up.   He is checking his sugars and running around 100 in ams. No open sores or wounds. No hypoglycemic events. He is taking toujeo, glipizide, synjardy. No CP, palpitations, headaches. He is having this dull ache over his sternum going on for months. Seen GI with no answers.   Thyroid has been out of controll but coming down on medication. Needs labs. Takes daily.  .. Active Ambulatory Problems    Diagnosis Date Noted   Hypothyroidism 01/06/2010   DM 01/06/2010   Hyperlipidemia associated with type 2 diabetes mellitus ([place]) 01/06/2010   OBESITY, UNSPECIFIED 05/15/2009   DEPRESSIVE DISORDER NOT ELSEWHERE CLASSIFIED 05/15/2009   Essential hypertension 01/27/2010   FATIGUE 01/05/2010   WEIGHT GAIN 05/15/2009   SHORTNESS OF BREATH 01/05/2010   CHEST PAIN, LEFT 01/05/2010   TOBACCO USE, QUIT 05/15/2009   Generalized anxiety disorder 04/28/2015   Type 2 diabetes mellitus with complication ([place]) 79/08/4095   Other specified hypothyroidism 04/28/2015   Depression 05/01/2015   Tooth pain 10/24/2015   Poor dentition 05/04/2016   GAD (generalized anxiety disorder) 06/06/2017   Depression, recurrent ([place]) 06/06/2017   Erectile dysfunction due to diseases classified elsewhere 06/06/2017   Right inguinal hernia 12/27/2017   Stress at home 03/29/2019   Eosinophils increased 04/24/2019   Acute recurrent pancreatitis 04/24/2019   Gastroesophageal reflux disease with esophagitis 03/24/2021   Uncontrolled type 2 diabetes mellitus with hyperglycemia ([place]) 03/25/2021   Decreased GFR 03/25/2021   Epigastric pain 03/25/2021   Resolved Ambulatory Problems    Diagnosis Date Noted   No Resolved Ambulatory Problems   Past Medical History:  Diagnosis Date   Depressed    Diabetes  mellitus without complication ([place])    Heart murmur    Hypertension      Review of Systems See HPI.     Objective:   Physical Exam Vitals reviewed.  Constitutional:      Appearance: Normal appearance.  HENT:     Head: Normocephalic.  Cardiovascular:     Rate and Rhythm: Normal rate and regular rhythm.     Pulses: Normal pulses.     Heart sounds: Normal heart sounds.  Pulmonary:     Effort: Pulmonary effort is normal.     Breath sounds: Normal breath sounds.  Abdominal:     General: Bowel sounds are normal. There is no distension.     Palpations: Abdomen is soft. There is no mass.     Tenderness: There is no abdominal tenderness. There is no right CVA tenderness, left CVA tenderness, guarding or rebound.     Hernia: No hernia is present.  Musculoskeletal:     Right lower leg: No edema.     Left lower leg: No edema.  Neurological:     General: No focal deficit present.     Mental Status: He is alert.  Psychiatric:        Mood and Affect: Mood normal.      .. Results for orders placed or performed in visit on 05/29/21  TSH  Result Value Ref Range   TSH 18.81 (H) 0.40 - 4.50 mIU/L  COMPLETE METABOLIC PANEL WITH GFR  Result Value Ref Range   Glucose, Bld 103 (H) 65 - 99 mg/dL  BUN 17 7 - 25 mg/dL   Creat 1.03 0.60 - 1.29 mg/dL   eGFR 92 > OR = 60 mL/min/1.93m   BUN/Creatinine Ratio NOT APPLICABLE 6 - 22 (calc)   Sodium 138 135 - 146 mmol/L   Potassium 4.5 3.5 - 5.3 mmol/L   Chloride 104 98 - 110 mmol/L   CO2 26 20 - 32 mmol/L   Calcium 9.3 8.6 - 10.3 mg/dL   Total Protein 6.7 6.1 - 8.1 g/dL   Albumin 4.3 3.6 - 5.1 g/dL   Globulin 2.4 1.9 - 3.7 g/dL (calc)   AG Ratio 1.8 1.0 - 2.5 (calc)   Total Bilirubin 0.5 0.2 - 1.2 mg/dL   Alkaline phosphatase (APISO) 81 36 - 130 U/L   AST 21 10 - 40 U/L   ALT 24 9 - 46 U/L  POCT glycosylated hemoglobin (Hb A1C)  Result Value Ref Range   Hemoglobin A1C 6.5 (A) 4.0 - 5.6 %   HbA1c POC (<> result, manual entry)      HbA1c, POC (prediabetic range)     HbA1c, POC (controlled diabetic range)         Assessment & Plan:  .Marland KitchenMarland KitchenEstuardowas seen today for diabetes.  Diagnoses and all orders for this visit:  Type 2 diabetes mellitus with complication ([place] -     POCT glycosylated hemoglobin (Hb A1C) -     Empagliflozin-metFORMIN HCl (SYNJARDY) 12.11-998 MG TABS; Take 1 tablet by mouth 2 (two) times daily.  Essential hypertension -     lisinopril (ZESTRIL) 20 MG tablet; Take 1 tablet (20 mg total) by mouth daily. -     COMPLETE METABOLIC PANEL WITH GFR  Hyperlipidemia associated with type 2 diabetes mellitus (HCC)  Hypothyroidism, unspecified type -     TSH  Neuropathy -     gabapentin (NEURONTIN) 100 MG capsule; Take 1-3 tablets as needed up to three times a day.  A1C so much better!!! Stay on same medications.  On ACE. BP to goal.  On statin.  Needs eye exam.  Covid vaccine x2 needs booster.  Flu shot UTD.  Pneumonia shot UTD.   Discussed sternal pain. ? Neuropathy. Try gabapentin. Not related to exertion.   Recheck TSH to make any adjustments.

## 2021-05-30 LAB — COMPLETE METABOLIC PANEL WITH GFR
AG Ratio: 1.8 (calc) (ref 1.0–2.5)
ALT: 24 U/L (ref 9–46)
AST: 21 U/L (ref 10–40)
Albumin: 4.3 g/dL (ref 3.6–5.1)
Alkaline phosphatase (APISO): 81 U/L (ref 36–130)
BUN: 17 mg/dL (ref 7–25)
CO2: 26 mmol/L (ref 20–32)
Calcium: 9.3 mg/dL (ref 8.6–10.3)
Chloride: 104 mmol/L (ref 98–110)
Creat: 1.03 mg/dL (ref 0.60–1.29)
Globulin: 2.4 g/dL (calc) (ref 1.9–3.7)
Glucose, Bld: 103 mg/dL — ABNORMAL HIGH (ref 65–99)
Potassium: 4.5 mmol/L (ref 3.5–5.3)
Sodium: 138 mmol/L (ref 135–146)
Total Bilirubin: 0.5 mg/dL (ref 0.2–1.2)
Total Protein: 6.7 g/dL (ref 6.1–8.1)
eGFR: 92 mL/min/{1.73_m2} (ref >=60)

## 2021-05-30 LAB — TSH: TSH: 18.81 mIU/L — ABNORMAL HIGH (ref 0.40–4.50)

## 2021-06-01 ENCOUNTER — Other Ambulatory Visit: Payer: Self-pay | Admitting: Physician Assistant

## 2021-06-01 MED ORDER — LEVOTHYROXINE SODIUM 175 MCG PO TABS: 175.0000 ug | ORAL_TABLET | Freq: Every day | ORAL | 0 refills | Status: DC

## 2021-06-01 NOTE — Progress Notes (Signed)
Austin Weeks,   Kidney function improved.  Liver looks great.  TSH is coming down but not in goal range yet! Increased levothyroxine. Recheck in 3 months.

## 2021-06-15 NOTE — Telephone Encounter (Signed)
The patient finally contacted the office back after reviewing his mychart and listening to a voicemail from October. [facility] and he [place] unable to schedule a return appoitnment until 1/23

## 2021-07-03 ENCOUNTER — Other Ambulatory Visit: Payer: Self-pay | Admitting: Physician Assistant

## 2021-07-06 ENCOUNTER — Other Ambulatory Visit (INDEPENDENT_AMBULATORY_CARE_PROVIDER_SITE_OTHER): Payer: 59

## 2021-07-06 DIAGNOSIS — K2100 Gastro-esophageal reflux disease with esophagitis, without bleeding: Secondary | ICD-10-CM

## 2021-07-06 DIAGNOSIS — R1013 Epigastric pain: Secondary | ICD-10-CM | POA: Diagnosis not present

## 2021-07-06 DIAGNOSIS — R195 Other fecal abnormalities: Secondary | ICD-10-CM

## 2021-07-06 DIAGNOSIS — K900 Celiac disease: Secondary | ICD-10-CM

## 2021-07-07 ENCOUNTER — Other Ambulatory Visit: Payer: 59

## 2021-07-07 DIAGNOSIS — K900 Celiac disease: Secondary | ICD-10-CM

## 2021-07-07 DIAGNOSIS — K2100 Gastro-esophageal reflux disease with esophagitis, without bleeding: Secondary | ICD-10-CM

## 2021-07-07 DIAGNOSIS — R195 Other fecal abnormalities: Secondary | ICD-10-CM

## 2021-07-07 DIAGNOSIS — R1013 Epigastric pain: Secondary | ICD-10-CM

## 2021-07-07 LAB — TISSUE TRANSGLUTAMINASE, IGA: (tTG) Ab, IgA: >250 U/mL — ABNORMAL HIGH

## 2021-07-07 NOTE — Addendum Note (Signed)
Addended by: Mervin Kung A on: 07/07/2021 11:19 AM   Modules accepted: Orders

## 2021-07-08 ENCOUNTER — Telehealth: Payer: Self-pay | Admitting: General Surgery

## 2021-07-08 LAB — IGA: Immunoglobulin A: 182 mg/dL (ref 47–310)

## 2021-07-08 NOTE — Telephone Encounter (Signed)
-----   Message from [facility] V, DO sent at 07/08/2021 11:55 AM EST ----- TTG IgA is quite elevated, >250.  This finding is consistent with Celiac Disease and correlates with recent duodenal biopsies.  Will follow-up on additional pending serologies.  Will be able to use TTG in the future to gauge response to gluten-free diet.    Okay to initiate gluten-free diet and if not already done, can initiate referral to Nutrition Clinic for assistance in initiating GFD.  To follow-up in the GI clinic.

## 2021-07-08 NOTE — Telephone Encounter (Signed)
Left a voicemail for the patient to contact the office to schedule a follow up and to discuss a gluten free diet, also need to check of he has seen the nutrition for help with Celiac diet.

## 2021-07-13 NOTE — Telephone Encounter (Signed)
Patient called and he was given information on a gluten free diet and he has already been scheduled. Is there anything else he needs to do?

## 2021-07-16 LAB — CELIAC AB TTG DGP TIGA

## 2021-07-16 LAB — CELIAC HLA RFLX TO ABS
DQ2 (DQA1 0501/0505,DQB1 02XX): POSITIVE
DQ8 (DQA1 03XX, DQB1 0302): NEGATIVE

## 2021-07-22 ENCOUNTER — Encounter: Payer: 59 | Attending: Gastroenterology | Admitting: Registered"

## 2021-07-22 ENCOUNTER — Encounter: Payer: Self-pay | Admitting: Registered"

## 2021-07-22 ENCOUNTER — Other Ambulatory Visit: Payer: Self-pay

## 2021-07-22 DIAGNOSIS — K900 Celiac disease: Secondary | ICD-10-CM | POA: Insufficient documentation

## 2021-07-22 NOTE — Progress Notes (Signed)
Medical Nutrition Therapy  Appointment Start time:  [phone]  Appointment End time:  1643  Primary concerns today: Celiac Disease  Referral diagnosis: R10.13,R19.5,K21.00,K90.0 Preferred learning style: no preference indicated Learning readiness: ready, change in progress   NUTRITION ASSESSMENT   Anthropometrics  Wt Readings from Last 3 Encounters:  05/29/21 214 lb (97.1 kg)  05/07/21 214 lb (97.1 kg)  05/05/21 214 lb 4 oz (97.2 kg)  Pt states last reading was 205 lb Usual body weight was 250 lb   Clinical Medical Hx: Type 2 Diabetes, others reviewed Medications: reviewed Labs: reviewed Notable Signs/Symptoms: abdominal pain, weight loss  Lifestyle & Dietary Hx For last 6 months has been having sxs consistent with Celiac Disease and can feel immediate sxs even with cross contamination. Pt states his diet is limited to usually 1 meal per day, today has only had potato chips.  Pt states he has been watching diet for a while with having T2D and now is paying attention also to gluten/wheat content of foods.  Pt reports neuropathy symptoms with minimal relief from gabapentin.   Pt states his wife has switched to gluten alternatives for pasta and other foods and is supportive in helping to manage diet.   Estimated daily fluid intake: 1-2 bottle water oz Supplements: none Sleep: 5 1/2 - 8 hrs - some nights has horrible pain in feet that disrupts sleep 2-3x/week.  Stress / self-care: 6-7/10 Current average weekly physical activity: a lot walking 5x/week at work  24-Hr Dietary Recall First Meal:   Snack:  Second Meal: gluten-free pizza (frozen) Snack:  Third Meal:  Snack:  Beverages: water, diet soda, milk, occasional spirits  NUTRITION DIAGNOSIS  NB-1.1 Food and nutrition-related knowledge deficit As related to pathophysiology of celiac disease and details regarding gluten.  As evidenced by pt states gain on new information in today's visit.   NUTRITION INTERVENTION   Nutrition education (E-1) on the following topics:  What is Gluten Resources for recipes and more information Importance to get proper nutrition  Handouts Provided Include  What is Gluten Sources of gluten and gluten free foods  Learning Style & Readiness for Change Teaching method utilized: Visual & Auditory  Demonstrated degree of understanding via: Teach Back  Barriers to learning/adherence to lifestyle change: none  Goals Established by Pt Consider starting a vitamin B-12 subligual supplement Consider using a nutritional supplement as you are working to get the, protein, vitamins and minerals. Look for at least 16 g protein up to 30 grams and not too high in sugar. 15 grams should be okay, but use your bloods sugar to help guide you on the sugar content. Continue reading labels for wheat, barley, rye, malt, etc. CartridgeClearance.com.cy is a great website to learn more   MONITORING & EVALUATION Dietary intake, weekly physical activity, and weight in prn.  Next Steps  Patient is to follow a strict gluten-free diet and contact our office if a follow-up visit is desired.

## 2021-07-22 NOTE — Patient Instructions (Addendum)
Consider starting a vitamin B-12 subligual supplement Consider using a nutritional supplement as you are working to get the, protein, vitamins and minerals. Look for at least 16 g protein up to 30 grams and not too high in sugar. 15 grams should be okay, but use your bloods sugar to help guide you on the sugar content. Continue reading labels for wheat, barley, rye, malt, etc. [employer]Clear[employer] is a great website to learn more

## 2021-08-06 ENCOUNTER — Ambulatory Visit (INDEPENDENT_AMBULATORY_CARE_PROVIDER_SITE_OTHER): Payer: 59 | Admitting: Gastroenterology

## 2021-08-06 ENCOUNTER — Encounter: Payer: Self-pay | Admitting: Gastroenterology

## 2021-08-06 ENCOUNTER — Other Ambulatory Visit: Payer: Self-pay

## 2021-08-06 VITALS — BP 110/78 | HR 91 | Ht 73.0 in | Wt 208.4 lb

## 2021-08-06 DIAGNOSIS — K900 Celiac disease: Secondary | ICD-10-CM

## 2021-08-06 NOTE — Progress Notes (Signed)
Chief Complaint:    Celiac Disease  GI History: 45 y.o. male with a history of HTN, hypothyroidism, CKD, diabetes, ccy, follows in the GI clinic for Celiac Disease.  Celiac Disease diagnosed by endoscopy in 04/2021.  Index symptoms of epigastric/LUQ pain, nausea without emesis, intermittent occasional fecal urgency and loose stools. - 02/2021: Normal CBC, H. pylori breath test negative - EGD (04/2021): Normal esophagus and stomach.  Mild inflammation of duodenal bulb (path: Villous blunting, increased intraepithelial lymphocytes, marsh 3C), Normal D2 (path: Villous blunting, increased intraepithelial lymphocytes, marsh 3A) - 06/2021: Elevated TTG IgA >250.  Positive HLA DQ 2, negative HLA DQ 8  HPI:     Patient is a 46 y.o. male presenting to the Gastroenterology Clinic for follow-up.   States he feels much better today since starting GFD. Improved energy.  Abdominal pain has essentially resolved.  No more nausea or loose stools.  Was seen in the [facility] which she felt was quite beneficial.   Review of systems:     No chest pain, no SOB, no fevers, no urinary sx   Past Medical History:  Diagnosis Date   Depressed    Diabetes mellitus without complication ([place])    Heart murmur    as a child   Hypertension    Hypothyroidism     Patient's surgical history, family medical history, social history, medications and allergies were all reviewed in Epic    Current Outpatient Medications  Medication Sig Dispense Refill   ACCU-CHEK GUIDE test strip USE AS DIRECTED 100 strip 1   AMBULATORY NON FORMULARY MEDICATION One touch ultra 2  Testing 2 twice a day. For Diabetes Mellitus type II, uncontrolled. 100 strip 5   atorvastatin (LIPITOR) 40 MG tablet Take 1 tablet (40 mg total) by mouth daily at 6 PM. 90 tablet 3   Continuous Blood Gluc Sensor (FREESTYLE LIBRE 14 DAY SENSOR) MISC 1 application by Does not apply route every 14 (fourteen) days. Apply upper deltoid every 14 days, use  reader to determine blood sugars 2 each 11   Empagliflozin-metFORMIN HCl (SYNJARDY) 12.11-998 MG TABS Take 1 tablet by mouth 2 (two) times daily. 180 tablet 0   gabapentin (NEURONTIN) 100 MG capsule Take 1-3 tablets as needed up to three times a day. 270 capsule 1   glipiZIDE (GLUCOTROL) 5 MG tablet Take 1 tablet (5 mg total) by mouth 2 (two) times daily before a meal. 180 tablet 0   insulin glargine, 1 Unit Dial, (TOUJEO SOLOSTAR) 300 UNIT/ML Solostar Pen Inject 26 Units into the skin at bedtime. 4.5 mL 1   levothyroxine (SYNTHROID) 175 MCG tablet Take 1 tablet (175 mcg total) by mouth daily. 90 tablet 0   lisinopril (ZESTRIL) 20 MG tablet Take 1 tablet (20 mg total) by mouth daily. 90 tablet 0   Monolet Lancets MISC by Does not apply route as directed.     pantoprazole (PROTONIX) 40 MG tablet Take 1 tablet (40 mg total) by mouth daily. 90 tablet 3   sildenafil (VIAGRA) 100 MG tablet Take 1 tablet (100 mg total) by mouth daily as needed for erectile dysfunction. 10 tablet 5   Vilazodone HCl (VIIBRYD) 20 MG TABS Take 1 tablet (20 mg total) by mouth daily. 90 tablet 1   No current facility-administered medications for this visit.    Physical Exam:     BP 110/78    Pulse 91    Ht _0  (1.854 m)    Wt 208 lb 6 oz (94.5  kg)    BMI 27.49 kg/m   GENERAL:  Pleasant male in NAD PSYCH: : Cooperative, normal affect Musculoskeletal:  Normal muscle tone, normal strength NEURO: Alert and oriented x 3, no focal neurologic deficits   IMPRESSION and PLAN:    1) Celiac Disease - Continue gluten-free diet - Check micronutrient panel in 1 year - RTC 12 months or sooner as needed        Lavena Bullion ,DO, FACG 08/06/2021, 11:26 AM

## 2021-08-06 NOTE — Patient Instructions (Addendum)
If you are age 44 or older, your body mass index should be between 23-30. Your There is no height or weight on file to calculate BMI. If this is out of the aforementioned range listed, please consider follow up with your Primary Care Provider.  If you are age 75 or younger, your body mass index should be between 19-25. Your There is no height or weight on file to calculate BMI. If this is out of the aformentioned range listed, please consider follow up with your Primary Care Provider.   __________________________________________________________  The [facility] providers would like to encourage you to use [facility] to communicate with providers for non-urgent requests or questions.  Due to long hold times on the telephone, sending your provider a message by [facility] may be a faster and more efficient way to get a response.  Please allow 48 business hours for a response.  Please remember that this is for non-urgent requests.   Due to recent changes in healthcare laws, you may see the results of your imaging and laboratory studies on MyChart before your provider has had a chance to review them.  We understand that in some cases there may be results that are confusing or concerning to you. Not all laboratory results come back in the same time frame and the provider may be waiting for multiple results in order to interpret others.  Please give [place] 48 hours in order for your provider to thoroughly review all the results before contacting the office for clarification of your results.   Please give [place] a call at [phone] in 12 months to schedule a follow up appointment.  Thank you for choosing me and [facility].  Vito Cirigliano, D.O.

## 2021-09-04 ENCOUNTER — Ambulatory Visit: Payer: 59 | Admitting: Physician Assistant

## 2021-09-04 DIAGNOSIS — E118 Type 2 diabetes mellitus with unspecified complications: Secondary | ICD-10-CM

## 2021-09-15 ENCOUNTER — Encounter: Payer: Self-pay | Admitting: Physician Assistant

## 2021-09-15 ENCOUNTER — Ambulatory Visit: Payer: 59 | Admitting: Physician Assistant

## 2021-09-15 ENCOUNTER — Other Ambulatory Visit: Payer: Self-pay

## 2021-09-15 VITALS — BP 124/76 | HR 110 | Ht 73.0 in | Wt 211.0 lb

## 2021-09-15 DIAGNOSIS — R42 Dizziness and giddiness: Secondary | ICD-10-CM

## 2021-09-15 DIAGNOSIS — F411 Generalized anxiety disorder: Secondary | ICD-10-CM

## 2021-09-15 DIAGNOSIS — F339 Major depressive disorder, recurrent, unspecified: Secondary | ICD-10-CM

## 2021-09-15 DIAGNOSIS — E785 Hyperlipidemia, unspecified: Secondary | ICD-10-CM

## 2021-09-15 DIAGNOSIS — E039 Hypothyroidism, unspecified: Secondary | ICD-10-CM

## 2021-09-15 DIAGNOSIS — E1169 Type 2 diabetes mellitus with other specified complication: Secondary | ICD-10-CM

## 2021-09-15 DIAGNOSIS — E1165 Type 2 diabetes mellitus with hyperglycemia: Secondary | ICD-10-CM | POA: Diagnosis not present

## 2021-09-15 DIAGNOSIS — I10 Essential (primary) hypertension: Secondary | ICD-10-CM | POA: Diagnosis not present

## 2021-09-15 DIAGNOSIS — L409 Psoriasis, unspecified: Secondary | ICD-10-CM

## 2021-09-15 DIAGNOSIS — E118 Type 2 diabetes mellitus with unspecified complications: Secondary | ICD-10-CM

## 2021-09-15 DIAGNOSIS — N521 Erectile dysfunction due to diseases classified elsewhere: Secondary | ICD-10-CM

## 2021-09-15 DIAGNOSIS — K2100 Gastro-esophageal reflux disease with esophagitis, without bleeding: Secondary | ICD-10-CM

## 2021-09-15 DIAGNOSIS — K900 Celiac disease: Secondary | ICD-10-CM | POA: Insufficient documentation

## 2021-09-15 LAB — POCT GLYCOSYLATED HEMOGLOBIN (HGB A1C): Hemoglobin A1C: 7.2 % — AB (ref 4.0–5.6)

## 2021-09-15 MED ORDER — CLOBETASOL PROPIONATE 0.05 % EX CREA: 1.0000 "application " | TOPICAL_CREAM | Freq: Two times a day (BID) | CUTANEOUS | 0 refills | Status: DC

## 2021-09-15 MED ORDER — GLIPIZIDE 5 MG PO TABS: 5.0000 mg | ORAL_TABLET | Freq: Two times a day (BID) | ORAL | 0 refills | Status: DC

## 2021-09-15 MED ORDER — ATORVASTATIN CALCIUM 40 MG PO TABS: 40.0000 mg | ORAL_TABLET | Freq: Every day | ORAL | 3 refills | Status: DC

## 2021-09-15 MED ORDER — VILAZODONE HCL 20 MG PO TABS: 1.0000 | ORAL_TABLET | Freq: Every day | ORAL | 3 refills | Status: DC

## 2021-09-15 MED ORDER — TOUJEO SOLOSTAR 300 UNIT/ML ~~LOC~~ SOPN: 26.0000 [IU] | PEN_INJECTOR | Freq: Every day | SUBCUTANEOUS | 1 refills | Status: DC

## 2021-09-15 MED ORDER — SYNJARDY 12.5-1000 MG PO TABS: 1.0000 | ORAL_TABLET | Freq: Two times a day (BID) | ORAL | 0 refills | Status: DC

## 2021-09-15 MED ORDER — LEVOTHYROXINE SODIUM 175 MCG PO TABS: 175.0000 ug | ORAL_TABLET | Freq: Every day | ORAL | 1 refills | Status: DC

## 2021-09-15 MED ORDER — PANTOPRAZOLE SODIUM 40 MG PO TBEC: 40.0000 mg | DELAYED_RELEASE_TABLET | Freq: Every day | ORAL | 3 refills | Status: DC

## 2021-09-15 MED ORDER — SILDENAFIL CITRATE 100 MG PO TABS: 100.0000 mg | ORAL_TABLET | Freq: Every day | ORAL | 5 refills | Status: DC | PRN

## 2021-09-15 MED ORDER — LISINOPRIL 20 MG PO TABS: 20.0000 mg | ORAL_TABLET | Freq: Every day | ORAL | 1 refills | Status: DC

## 2021-09-15 NOTE — Patient Instructions (Signed)
Dizziness Dizziness is a common problem. It is a feeling of unsteadiness or light-headedness. You may feel like you are about to faint. Dizziness can lead to injury if you stumble or fall. Anyone can become dizzy, but dizziness is more common in older adults. This condition can be caused by a number of things, including medicines, dehydration, or illness. Follow these instructions at home: Eating and drinking  Drink enough fluid to keep your urine pale yellow. This helps to keep you from becoming dehydrated. Try to drink more clear fluids, such as water. Do not drink alcohol. Limit your caffeine intake if told to do so by your health care provider. Check ingredients and nutrition facts to see if a food or beverage contains caffeine. Limit your salt (sodium) intake if told to do so by your health care provider. Check ingredients and nutrition facts to see if a food or beverage contains sodium. Activity  Avoid making quick movements. Rise slowly from chairs and steady yourself until you feel okay. In the morning, first sit up on the side of the bed. When you feel okay, stand slowly while you hold onto something until you know that your balance is good. If you need to stand in one place for a long time, move your legs often. Tighten and relax the muscles in your legs while you are standing. Do not drive or use machinery if you feel dizzy. Avoid bending down if you feel dizzy. Place items in your home so that they are easy for you to reach without leaning over. Lifestyle Do not use any products that contain nicotine or tobacco. These products include cigarettes, chewing tobacco, and vaping devices, such as e-cigarettes. If you need help quitting, ask your health care provider. Try to reduce your stress level by using methods such as yoga or meditation. Talk with your health care provider if you need help to manage your stress. General instructions Watch your dizziness for any changes. Take  over-the-counter and prescription medicines only as told by your health care provider. Talk with your health care provider if you think that your dizziness is caused by a medicine that you are taking. Tell a friend or a family member that you are feeling dizzy. If he or she notices any changes in your behavior, have this person call your health care provider. Keep all follow-up visits. This is important. Contact a health care provider if: Your dizziness does not go away or you have new symptoms. Your dizziness or light-headedness gets worse. You feel nauseous. You have reduced hearing. You have a fever. You have neck pain or a stiff neck. Your dizziness leads to an injury or a fall. Get help right away if: You vomit or have diarrhea and are unable to eat or drink anything. You have problems talking, walking, swallowing, or using your arms, hands, or legs. You feel generally weak. You have any bleeding. You are not thinking clearly or you have trouble forming sentences. It may take a friend or family member to notice this. You have chest pain, abdominal pain, shortness of breath, or sweating. Your vision changes or you develop a severe headache. These symptoms may represent a serious problem that is an emergency. Do not wait to see if the symptoms will go away. Get medical help right away. Call your local emergency services (911 in the [place] Do not drive yourself to the hospital. Summary Dizziness is a feeling of unsteadiness or light-headedness. This condition can be caused by a number of   things, including medicines, dehydration, or illness. Anyone can become dizzy, but dizziness is more common in older adults. Drink enough fluid to keep your urine pale yellow. Do not drink alcohol. Avoid making quick movements if you feel dizzy. Monitor your dizziness for any changes. This information is not intended to replace advice given to you by your health care provider. Make sure you discuss any  questions you have with your health care provider. Document Revised: 06/16/2020 Document Reviewed: 06/16/2020 Elsevier Patient Education  2022 Elsevier Inc.  

## 2021-09-15 NOTE — Progress Notes (Signed)
Subjective:    Patient ID: Austin Weeks, male    DOB: 1975/08/09, 46 y.o.   MRN: [id number]  HPI  This patient is a well appearing 46 year old male with history of type 2 diabetes with a new chief complaint of dizziness for the past 2 days. He describes this as "feeling drunk, especially when walking around" and feeling "in a haze" when talking to people. He reports that this feels different from previous instances of feeling lightheaded when going from sitting to standing. He denies any falls or other trauma. Denies any episodes of hypoglycemia recently (his lowest blood glucose reading was around 85). No urinary symptoms. He also reports a recent diagnosis of celiac disease and seen his gastrointestinal symptoms greatly improve with his diet changes.  Review of Systems  Constitutional:  Positive for fever (mild). Negative for fatigue and unexpected weight change.  HENT: Negative.  Negative for congestion, rhinorrhea, sore throat and tinnitus.   Eyes:  Negative for discharge.  Respiratory: Negative.  Negative for cough, chest tightness and shortness of breath.   Cardiovascular: Negative.  Negative for chest pain.  Gastrointestinal: Negative.  Negative for constipation, diarrhea and nausea.  Neurological:  Positive for dizziness ("feeling drunk when walking").      Objective:   Physical Exam Constitutional:      General: He is not in acute distress.    Appearance: Normal appearance.  HENT:     Head: Normocephalic and atraumatic.     Right Ear: Tympanic membrane and ear canal normal.     Left Ear: Tympanic membrane and ear canal normal.     Ears:     Comments: Scaling and mild erythema of skin posterior to auricles bilaterally    Nose: No congestion or rhinorrhea.  Cardiovascular:     Rate and Rhythm: Normal rate and regular rhythm.     Pulses: Normal pulses.     Heart sounds: Normal heart sounds. No murmur heard.   No friction rub. No gallop.  Pulmonary:     Effort: Pulmonary  effort is normal.     Breath sounds: Normal breath sounds. No wheezing or rales.  Neurological:     General: No focal deficit present.     Mental Status: He is alert and oriented to person, place, and time.     Comments: Negative Dix-Hallpike maneuver  Psychiatric:        Mood and Affect: Mood normal.        Behavior: Behavior normal.   .. Results for orders placed or performed in visit on 09/15/21  POCT glycosylated hemoglobin (Hb A1C)  Result Value Ref Range   Hemoglobin A1C 7.2 (A) 4.0 - 5.6 %   HbA1c POC (<> result, manual entry)     HbA1c, POC (prediabetic range)     HbA1c, POC (controlled diabetic range)            Assessment & Plan:  Marland KitchenMarland KitchenDat was seen today for follow-up.  Diagnoses and all orders for this visit:  Uncontrolled type 2 diabetes mellitus with hyperglycemia (HCC) -     POCT glycosylated hemoglobin (Hb A1C) -     Empagliflozin-metFORMIN HCl (SYNJARDY) 12.11-998 MG TABS; Take 1 tablet by mouth 2 (two) times daily. -     glipiZIDE (GLUCOTROL) 5 MG tablet; Take 1 tablet (5 mg total) by mouth 2 (two) times daily before a meal. -     insulin glargine, 1 Unit Dial, (TOUJEO SOLOSTAR) 300 UNIT/ML Solostar Pen; Inject 26 Units into  the skin at bedtime.  Type 2 diabetes mellitus with complication (HCC) -     Ambulatory referral to Ophthalmology -     COMPLETE METABOLIC PANEL WITH GFR  Hyperlipidemia associated with type 2 diabetes mellitus (HCC) -     atorvastatin (LIPITOR) 40 MG tablet; Take 1 tablet (40 mg total) by mouth daily at 6 PM. -     Lipid Panel w/reflex Direct LDL  Essential hypertension -     lisinopril (ZESTRIL) 20 MG tablet; Take 1 tablet (20 mg total) by mouth daily.  Erectile dysfunction due to diseases classified elsewhere -     sildenafil (VIAGRA) 100 MG tablet; Take 1 tablet (100 mg total) by mouth daily as needed for erectile dysfunction.  GAD (generalized anxiety disorder) -     Vilazodone HCl (VIIBRYD) 20 MG TABS; Take 1 tablet (20 mg  total) by mouth daily.  Depression, recurrent (HCC) -     Vilazodone HCl (VIIBRYD) 20 MG TABS; Take 1 tablet (20 mg total) by mouth daily.  Celiac disease  Psoriasis -     clobetasol cream (TEMOVATE) 0.05 %; Apply 1 application topically 2 (two) times daily.  Acquired hypothyroidism -     levothyroxine (SYNTHROID) 175 MCG tablet; Take 1 tablet (175 mcg total) by mouth daily.  Gastroesophageal reflux disease with esophagitis, unspecified whether hemorrhage -     pantoprazole (PROTONIX) 40 MG tablet; Take 1 tablet (40 mg total) by mouth daily.  Dizziness   A1C not to goal. Keep same meds suspect some increased carbs due to new celiac diet. Goal is to get meds the same and just work on diet for the next 3 months BP to goal. Cmp ordered. On statin. Lipid ordered. Referral placed for eye exam Foot exam UTD.  Flu/pneumonia UTD.  Covid vaccine x2.  Follow up in 3 months.   Discussed dizziness for 2 days.  Not associated with hypoglycemia. Dix hallpike negative. He did have a significant diastolic change on orthostatics Work on hydration  Follow up if not resolving or worsening in the next week

## 2021-09-16 ENCOUNTER — Encounter: Payer: Self-pay | Admitting: Physician Assistant

## 2021-09-16 DIAGNOSIS — R42 Dizziness and giddiness: Secondary | ICD-10-CM | POA: Insufficient documentation

## 2021-09-17 LAB — COMPLETE METABOLIC PANEL WITH GFR
AG Ratio: 1.6 (calc) (ref 1.0–2.5)
ALT: 24 U/L (ref 9–46)
AST: 18 U/L (ref 10–40)
Albumin: 4.7 g/dL (ref 3.6–5.1)
Alkaline phosphatase (APISO): 85 U/L (ref 36–130)
BUN: 17 mg/dL (ref 7–25)
CO2: 28 mmol/L (ref 20–32)
Calcium: 9.6 mg/dL (ref 8.6–10.3)
Chloride: 104 mmol/L (ref 98–110)
Creat: 1.06 mg/dL (ref 0.60–1.29)
Globulin: 3 g/dL (calc) (ref 1.9–3.7)
Glucose, Bld: 138 mg/dL — ABNORMAL HIGH (ref 65–99)
Potassium: 4.8 mmol/L (ref 3.5–5.3)
Sodium: 140 mmol/L (ref 135–146)
Total Bilirubin: 0.9 mg/dL (ref 0.2–1.2)
Total Protein: 7.7 g/dL (ref 6.1–8.1)
eGFR: 89 mL/min/{1.73_m2} (ref >=60)

## 2021-09-17 LAB — LIPID PANEL W/REFLEX DIRECT LDL
Cholesterol: 123 mg/dL (ref <200)
HDL: 31 mg/dL — ABNORMAL LOW (ref >=40)
LDL Cholesterol (Calc): 72 mg/dL (calc)
Non-HDL Cholesterol (Calc): 92 mg/dL (calc) (ref <130)
Total CHOL/HDL Ratio: 4 (calc) (ref <5.0)
Triglycerides: 118 mg/dL (ref <150)

## 2021-09-17 NOTE — Progress Notes (Signed)
Kidney and liver look great.  LDL very close to goal at 72.  Labs look really good.

## 2021-10-28 LAB — HM DIABETES EYE EXAM

## 2021-10-29 ENCOUNTER — Encounter: Payer: Self-pay | Admitting: Neurology

## 2021-11-13 ENCOUNTER — Emergency Department
Admission: EM | Admit: 2021-11-13 | Discharge: 2021-11-13 | Disposition: A | Discharge status: Home / Self Care | Payer: 59 | Source: Home / Self Care

## 2021-11-13 DIAGNOSIS — K047 Periapical abscess without sinus: Secondary | ICD-10-CM

## 2021-11-13 MED ORDER — AMOXICILLIN-POT CLAVULANATE 875-125 MG PO TABS: 1.0000 | ORAL_TABLET | Freq: Two times a day (BID) | ORAL | 0 refills | Status: AC

## 2021-11-13 NOTE — ED Triage Notes (Signed)
Pt presents with dental pain and infection. Pt states he has an appt scheduled for 4/27 to have the tooth removed ?

## 2021-11-13 NOTE — Discharge Instructions (Addendum)
Advised patient to take medication as directed with food to completion.  Encouraged patient increase daily water intake while taking this medication.  Patient if symptoms worsen and/or unresolved please follow-up with dentist for further evaluation or here. ?

## 2021-11-13 NOTE — ED Provider Notes (Signed)
?[facility] ? ? ? ?CSN: [id number] ?Arrival date & time: 11/13/21  1026 ? ? ?  ? ?History   ?Chief Complaint ?Chief Complaint  ?Patient presents with  ? Dental Pain  ? ? ?HPI ?Austin DawleyDavid A Deloach is a 46 y.o. male.  ? ?HPI 46 year old male presents with dental pain and infection x 2 weeks.  Reports has scheduled dental appointment for the 27th of this month to have tooth removed.  PMH significant for HTN and hypothyroidism. ? ?Past Medical History:  ?Diagnosis Date  ? Depressed   ? Diabetes mellitus without complication (HCC)   ? Heart murmur   ? as a child  ? Hypertension   ? Hypothyroidism   ? ? ?Patient Active Problem List  ? Diagnosis Date Noted  ? Dizziness 09/16/2021  ? Celiac disease 09/15/2021  ? Psoriasis 09/15/2021  ? Uncontrolled type 2 diabetes mellitus with hyperglycemia (HCC) 03/25/2021  ? Decreased GFR 03/25/2021  ? Epigastric pain 03/25/2021  ? Gastroesophageal reflux disease with esophagitis 03/24/2021  ? Eosinophils increased 04/24/2019  ? Acute recurrent pancreatitis 04/24/2019  ? Stress at home 03/29/2019  ? Right inguinal hernia 12/27/2017  ? GAD (generalized anxiety disorder) 06/06/2017  ? Depression, recurrent (HCC) 06/06/2017  ? Erectile dysfunction due to diseases classified elsewhere 06/06/2017  ? Poor dentition 05/04/2016  ? Tooth pain 10/24/2015  ? Depression 05/01/2015  ? Generalized anxiety disorder 04/28/2015  ? Type 2 diabetes mellitus with complication, with long-term current use of insulin (HCC) 04/28/2015  ? Other specified hypothyroidism 04/28/2015  ? Essential hypertension 01/27/2010  ? Hypothyroidism 01/06/2010  ? DM 01/06/2010  ? Hyperlipidemia associated with type 2 diabetes mellitus (HCC) 01/06/2010  ? FATIGUE 01/05/2010  ? SHORTNESS OF BREATH 01/05/2010  ? CHEST PAIN, LEFT 01/05/2010  ? OBESITY, UNSPECIFIED 05/15/2009  ? DEPRESSIVE DISORDER NOT ELSEWHERE CLASSIFIED 05/15/2009  ? WEIGHT GAIN 05/15/2009  ? TOBACCO USE, QUIT 05/15/2009  ? ? ?Past Surgical History:   ?Procedure Laterality Date  ? CHOLECYSTECTOMY    ? ? ? ? ? ?Home Medications   ? ?Prior to Admission medications   ?Medication Sig Start Date End Date Taking? Authorizing Provider  ?amoxicillin-clavulanate (AUGMENTIN) 875-125 MG tablet Take 1 tablet by mouth 2 (two) times daily for 10 days. 11/13/21 11/23/21 Yes Trevor Ihaagan, Jennifer Payes, FNP  ?ACCU-CHEK GUIDE test strip USE AS DIRECTED 09/20/19   Jomarie LongsBreeback, Jade L, PA-C  ?AMBULATORY NON FORMULARY MEDICATION One touch ultra 2  ?Testing 2 twice a day. ?For Diabetes Mellitus type II, uncontrolled. 03/26/19   Breeback, Lonna CobbJade L, PA-C  ?atorvastatin (LIPITOR) 40 MG tablet Take 1 tablet (40 mg total) by mouth daily at 6 PM. 09/15/21   Breeback, Jade L, PA-C  ?clobetasol cream (TEMOVATE) 0.05 % Apply 1 application topically 2 (two) times daily. 09/15/21   Jomarie LongsBreeback, Jade L, PA-C  ?Continuous Blood Gluc Sensor (FREESTYLE LIBRE 14 DAY SENSOR) MISC 1 application by Does not apply route every 14 (fourteen) days. Apply upper deltoid every 14 days, use reader to determine blood sugars 02/24/21   Jomarie LongsBreeback, Jade L, PA-C  ?Empagliflozin-metFORMIN HCl (SYNJARDY) 12.11-998 MG TABS Take 1 tablet by mouth 2 (two) times daily. 09/15/21   Breeback, Lonna CobbJade L, PA-C  ?gabapentin (NEURONTIN) 100 MG capsule Take 1-3 tablets as needed up to three times a day. 05/29/21   Breeback, Lonna CobbJade L, PA-C  ?glipiZIDE (GLUCOTROL) 5 MG tablet Take 1 tablet (5 mg total) by mouth 2 (two) times daily before a meal. 09/15/21   Breeback, Jade L, PA-C  ?insulin  glargine, 1 Unit Dial, (TOUJEO SOLOSTAR) 300 UNIT/ML Solostar Pen Inject 26 Units into the skin at bedtime. 09/15/21   Jomarie Longs, PA-C  ?levothyroxine (SYNTHROID) 175 MCG tablet Take 1 tablet (175 mcg total) by mouth daily. 09/15/21   Breeback, Lesly Rubenstein L, PA-C  ?lisinopril (ZESTRIL) 20 MG tablet Take 1 tablet (20 mg total) by mouth daily. 09/15/21   Jomarie Longs, PA-C  ?Monolet Lancets MISC by Does not apply route as directed.    [provider]  ?pantoprazole  (PROTONIX) 40 MG tablet Take 1 tablet (40 mg total) by mouth daily. 09/15/21   Jomarie Longs, PA-C  ?sildenafil (VIAGRA) 100 MG tablet Take 1 tablet (100 mg total) by mouth daily as needed for erectile dysfunction. 09/15/21   Jomarie Longs, PA-C  ?Vilazodone HCl (VIIBRYD) 20 MG TABS Take 1 tablet (20 mg total) by mouth daily. ?Patient not taking: Reported on 11/13/2021 09/15/21   Jomarie Longs, PA-C  ? ? ?Family History ?Family History  ?Problem Relation Age of Onset  ? Depression Mother   ? Diabetes Mother   ? Hypertension Mother   ? Hyperlipidemia Mother   ? Cancer Maternal Grandmother   ? Coronary artery disease Maternal Grandfather   ? Heart attack Maternal Grandfather   ? Alcohol abuse Paternal Grandfather   ? Stroke Paternal Grandfather   ? Diabetes Maternal Aunt   ? Diabetes Maternal Aunt   ? Sleep apnea Maternal Uncle   ? Alcohol abuse Maternal Uncle   ? Diabetes Maternal Uncle   ? Alcohol abuse Paternal Uncle   ? Colon cancer Neg Hx   ? Rectal cancer Neg Hx   ? Pancreatic cancer Neg Hx   ? Esophageal cancer Neg Hx   ? Liver cancer Neg Hx   ? ? ?Social History ?Social History  ? ?Tobacco Use  ? Smoking status: Former  ? Smokeless tobacco: Never  ? Tobacco comments:  ?  quit Jan 2010   ?Vaping Use  ? Vaping Use: Every day  ?Substance Use Topics  ? Alcohol use: Not Currently  ?  Comment: 3 etoh/week  ? Drug use: No  ? ? ? ?Allergies   ?Ozempic (0.25 or 0.5 mg-dose) [semaglutide(0.25 or 0.5mg -dos)] ? ? ?Review of Systems ?Review of Systems  ?HENT:  Positive for dental problem.   ?All other systems reviewed and are negative. ? ? ?Physical Exam ?Triage Vital Signs ?ED Triage Vitals  ?Enc Vitals Group  ?   BP 11/13/21 1036 (!) 146/93  ?   Pulse Rate 11/13/21 1036 83  ?   Resp 11/13/21 1036 14  ?   Temp 11/13/21 1036 98.2 ?F (36.8 ?C)  ?   Temp Source 11/13/21 1036 Oral  ?   SpO2 11/13/21 1036 98 %  ?   Weight --   ?   Height --   ?   Head Circumference --   ?   Peak Flow --   ?   Pain Score 11/13/21 1038 3  ?    Pain Loc --   ?   Pain Edu? --   ?   Excl. in GC? --   ? ?No data found. ? ?Updated Vital Signs ?BP (!) 146/93 (BP Location: Left Arm)   Pulse 83   Temp 98.2 ?F (36.8 ?C) (Oral)   Resp 14   SpO2 98%  ? ? ?Physical Exam ?Vitals and nursing note reviewed.  ?Constitutional:   ?   General: He is not in acute  distress. ?   Appearance: Normal appearance. He is normal weight. He is not ill-appearing.  ?HENT:  ?   Head: Normocephalic and atraumatic.  ?   Mouth/Throat:  ?   Mouth: Mucous membranes are moist.  ?   Pharynx: Oropharynx is clear.  ?   Comments: Right lower jaw third molar missing, second molar erythematous at medial/lateral gingival border; first and second premolar missing, with erythematous medial/lateral gingival borders ?Eyes:  ?   Extraocular Movements: Extraocular movements intact.  ?   Conjunctiva/sclera: Conjunctivae normal.  ?   Pupils: Pupils are equal, round, and reactive to light.  ?Cardiovascular:  ?   Rate and Rhythm: Normal rate and regular rhythm.  ?   Pulses: Normal pulses.  ?   Heart sounds: Normal heart sounds.  ?Pulmonary:  ?   Effort: Pulmonary effort is normal.  ?   Breath sounds: Normal breath sounds. No wheezing, rhonchi or rales.  ?Musculoskeletal:  ?   Cervical back: Normal range of motion and neck supple.  ?Skin: ?   General: Skin is warm and dry.  ?Neurological:  ?   General: No focal deficit present.  ?   Mental Status: He is alert and oriented to person, place, and time. Mental status is at baseline.  ? ? ? ?UC Treatments / Results  ?Labs ?(all labs ordered are listed, but only abnormal results are displayed) ?Labs Reviewed - No data to display ? ?EKG ? ? ?Radiology ?No results found. ? ?Procedures ?Procedures (including critical care time) ? ?Medications Ordered in UC ?Medications - No data to display ? ?Initial Impression / Assessment and Plan / UC Course  ?I have reviewed the triage vital signs and the nursing notes. ? ?Pertinent labs & imaging results that were available  during my care of the patient were reviewed by me and considered in my medical decision making (see chart for details). ? ?  ? ?MDM: 1.  Dental abscess-Rx'd Augmentin. Advised patient to take medication as directed with f

## 2021-12-18 ENCOUNTER — Ambulatory Visit: Payer: 59 | Admitting: Physician Assistant

## 2022-07-02 ENCOUNTER — Other Ambulatory Visit: Payer: Self-pay | Admitting: Physician Assistant

## 2022-07-02 DIAGNOSIS — E039 Hypothyroidism, unspecified: Secondary | ICD-10-CM

## 2022-10-05 ENCOUNTER — Encounter: Payer: Self-pay | Admitting: Emergency Medicine

## 2022-10-05 ENCOUNTER — Ambulatory Visit
Admission: EM | Admit: 2022-10-05 | Discharge: 2022-10-05 | Disposition: A | Discharge status: Home / Self Care | Payer: 59 | Attending: Family Medicine | Admitting: Family Medicine

## 2022-10-05 DIAGNOSIS — L0231 Cutaneous abscess of buttock: Secondary | ICD-10-CM | POA: Diagnosis not present

## 2022-10-05 MED ORDER — AMOXICILLIN-POT CLAVULANATE 875-125 MG PO TABS: 1.0000 | ORAL_TABLET | Freq: Two times a day (BID) | ORAL | 0 refills | Status: DC

## 2022-10-05 MED ORDER — IBUPROFEN 800 MG PO TABS: 800.0000 mg | ORAL_TABLET | Freq: Three times a day (TID) | ORAL | 0 refills | Status: DC

## 2022-10-05 NOTE — Discharge Instructions (Signed)
Warm compresses may help Take ibuprofen 3 times a day with food.  This will help with pain and swelling Take Augmentin 2 times a day.  This is for the infection The small piece of gauze in the opening will fall out in a day or 2.  It is easily removed Keep a bandage or dressing on it as long as it is draining Call for problems

## 2022-10-05 NOTE — ED Provider Notes (Signed)
[facility]    CSN: [id number] Arrival date & time: 10/05/22  1336      History   Chief Complaint No chief complaint on file.   HPI ALEXANDRE REXROAD is a 47 y.o. male.   HPI  Patient has a large abscess on his left buttock.  He has been using warm compresses.  It has not drained.  It is very painful.  No fever or chills.  He is an insulin-dependent diabetic.  States that sugars have been well-controlled  Past Medical History:  Diagnosis Date   Depressed    Diabetes mellitus without complication ([place])    Heart murmur    as a child   Hypertension    Hypothyroidism     Patient Active Problem List   Diagnosis Date Noted   Dizziness 09/16/2021   Celiac disease 09/15/2021   Psoriasis 09/15/2021   Uncontrolled type 2 diabetes mellitus with hyperglycemia ([place]) 03/25/2021   Decreased GFR 03/25/2021   Epigastric pain 03/25/2021   Gastroesophageal reflux disease with esophagitis 03/24/2021   Eosinophils increased 04/24/2019   Acute recurrent pancreatitis 04/24/2019   Stress at home 03/29/2019   Right inguinal hernia 12/27/2017   GAD (generalized anxiety disorder) 06/06/2017   Depression, recurrent ([place]) 06/06/2017   Erectile dysfunction due to diseases classified elsewhere 06/06/2017   Poor dentition 05/04/2016   Tooth pain 10/24/2015   Depression 05/01/2015   Generalized anxiety disorder 04/28/2015   Type 2 diabetes mellitus with complication, with long-term current use of insulin ([place]) 04/28/2015   Other specified hypothyroidism 04/28/2015   Essential hypertension 01/27/2010   Hypothyroidism 01/06/2010   DM 01/06/2010   Hyperlipidemia associated with type 2 diabetes mellitus ([place]) 01/06/2010   FATIGUE 01/05/2010   SHORTNESS OF BREATH 01/05/2010   CHEST PAIN, LEFT 01/05/2010   OBESITY, UNSPECIFIED 05/15/2009   DEPRESSIVE DISORDER NOT ELSEWHERE CLASSIFIED 05/15/2009   WEIGHT GAIN 05/15/2009   TOBACCO USE, QUIT 05/15/2009    Past Surgical History:   Procedure Laterality Date   CHOLECYSTECTOMY         Home Medications    Prior to Admission medications   Medication Sig Start Date End Date Taking? Authorizing Provider  AMBULATORY NON FORMULARY MEDICATION One touch ultra 2  Testing 2 twice a day. For Diabetes Mellitus type II, uncontrolled. 03/26/19  Yes Breeback, Jade L, PA-C  amoxicillin-clavulanate (AUGMENTIN) 875-125 MG tablet Take 1 tablet by mouth every 12 (twelve) hours. 10/05/22  Yes Raylene Everts, MD  atorvastatin (LIPITOR) 40 MG tablet Take 1 tablet (40 mg total) by mouth daily at 6 PM. 09/15/21  Yes Breeback, Jade L, PA-C  clobetasol cream (TEMOVATE) [id number] % Apply 1 application topically 2 (two) times daily. 09/15/21  Yes Breeback, Jade L, PA-C  Empagliflozin-metFORMIN HCl (SYNJARDY) 12.11-998 MG TABS Take 1 tablet by mouth 2 (two) times daily. 09/15/21  Yes Breeback, Jade L, PA-C  glipiZIDE (GLUCOTROL) 5 MG tablet Take 1 tablet (5 mg total) by mouth 2 (two) times daily before a meal. 09/15/21  Yes Breeback, Jade L, PA-C  ibuprofen (ADVIL) 800 MG tablet Take 1 tablet (800 mg total) by mouth 3 (three) times daily. 10/05/22  Yes Raylene Everts, MD  insulin glargine, 1 Unit Dial, (TOUJEO SOLOSTAR) 300 UNIT/ML Solostar Pen Inject 26 Units into the skin at bedtime. 09/15/21  Yes Breeback, Jade L, PA-C  levothyroxine (SYNTHROID) 175 MCG tablet Take 1 tablet (175 mcg total) by mouth daily. Needs labs/visit for refills 07/02/22  Yes Breeback, Jade L, PA-C  lisinopril (ZESTRIL) 20 MG tablet Take 1 tablet (20 mg total) by mouth daily. 09/15/21  Yes Breeback, Jade L, PA-C  sildenafil (VIAGRA) 100 MG tablet Take 1 tablet (100 mg total) by mouth daily as needed for erectile dysfunction. 09/15/21  Yes Breeback, Jade L, PA-C  Vilazodone HCl (VIIBRYD) 20 MG TABS Take 1 tablet (20 mg total) by mouth daily. 09/15/21  Yes Breeback, Jade L, PA-C  ACCU-CHEK GUIDE test strip USE AS DIRECTED 09/20/19   Breeback, Jade L, PA-C  Continuous Blood Gluc Sensor  (FREESTYLE LIBRE 14 DAY SENSOR) MISC 1 application by Does not apply route every 14 (fourteen) days. Apply upper deltoid every 14 days, use reader to determine blood sugars 02/24/21   Breeback, Jade L, PA-C  gabapentin (NEURONTIN) 100 MG capsule Take 1-3 tablets as needed up to three times a day. 05/29/21   Donella Stade, PA-C  [place] Lancets MISC by Does not apply route as directed.    [provider]  pantoprazole (PROTONIX) 40 MG tablet Take 1 tablet (40 mg total) by mouth daily. 09/15/21   Donella Stade, PA-C    Family History Family History  Problem Relation Age of Onset   Depression Mother    Diabetes Mother    Hypertension Mother    Hyperlipidemia Mother    Cancer Maternal Grandmother    Coronary artery disease Maternal Grandfather    Heart attack Maternal Grandfather    Alcohol abuse Paternal Grandfather    Stroke Paternal Grandfather    Diabetes Maternal Aunt    Diabetes Maternal Aunt    Sleep apnea Maternal Uncle    Alcohol abuse Maternal Uncle    Diabetes Maternal Uncle    Alcohol abuse Paternal Uncle    Colon cancer Neg Hx    Rectal cancer Neg Hx    Pancreatic cancer Neg Hx    Esophageal cancer Neg Hx    Liver cancer Neg Hx     Social History Social History   Tobacco Use   Smoking status: Former   Smokeless tobacco: Never   Tobacco comments:    quit Jan 2010   Vaping Use   Vaping Use: Every day  Substance Use Topics   Alcohol use: Not Currently    Comment: 3 etoh/week   Drug use: No     Allergies   Ozempic (0.25 or 0.5 mg-dose) [semaglutide(0.25 or 0.'5mg'$ -dos)] and Gluten meal   Review of Systems Review of Systems  See HPI  Triage Vital Signs ED Triage Vitals  Enc Vitals Group     BP 10/05/22 1347 139/87     Pulse Rate 10/05/22 1347 99     Resp 10/05/22 1347 16     Temp 10/05/22 1347 98.3 F (36.8 C)     Temp Source 10/05/22 1347 Oral     SpO2 10/05/22 1347 97 %     Weight --      Height --      Head Circumference --       Peak Flow --      Pain Score 10/05/22 1351 6     Pain Loc --      Pain Edu? --      Excl. in [place]? --    No data found.  Updated Vital Signs BP 139/87 (BP Location: Left Arm)   Pulse 99   Temp 98.3 F (36.8 C) (Oral)   Resp 16   SpO2 97%       Physical Exam Constitutional:  General: He is not in acute distress.    Appearance: He is well-developed.  HENT:     Head: Normocephalic and atraumatic.  Eyes:     Conjunctiva/sclera: Conjunctivae normal.     Pupils: Pupils are equal, round, and reactive to light.  Cardiovascular:     Rate and Rhythm: Normal rate.  Pulmonary:     Effort: Pulmonary effort is normal. No respiratory distress.  Abdominal:     General: There is no distension.     Palpations: Abdomen is soft.  Musculoskeletal:        General: Normal range of motion.     Cervical back: Normal range of motion.  Skin:    General: Skin is warm and dry.     Comments: 6 cm erythematous indurated lesion on the upper outer quadrant of the left buttock  Neurological:     Mental Status: He is alert.      UC Treatments / Results  Labs (all labs ordered are listed, but only abnormal results are displayed) Labs Reviewed - No data to display  EKG   Radiology No results found.  Procedures Incision and Drainage  Date/Time: 10/05/2022 2:44 PM  Performed by: Raylene Everts, MD Authorized by: Raylene Everts, MD   Consent:    Consent obtained:  Verbal   Consent given by:  Patient   Risks, benefits, and alternatives were discussed: yes     Risks discussed:  Incomplete drainage Universal protocol:    Patient identity confirmed:  Arm band and verbally with patient Location:    Type:  Abscess   Location:  Trunk   Trunk location:  Back Pre-procedure details:    Skin preparation:  Antiseptic wash Anesthesia:    Anesthesia method:  Local infiltration   Local anesthetic:  Lidocaine 1% WITH epi Procedure type:    Complexity:  Simple Procedure details:     Incision types:  Stab incision   Incision depth:  Subcutaneous   Wound management:  Probed and deloculated   Drainage:  Purulent   Drainage amount:  Moderate   Wound treatment:  Wound left open and drain placed Post-procedure details:    Procedure completion:  Tolerated  (including critical care time)  Medications Ordered in UC Medications - No data to display  Initial Impression / Assessment and Plan / UC Course  I have reviewed the triage vital signs and the nursing notes.  Pertinent labs & imaging results that were available during my care of the patient were reviewed by me and considered in my medical decision making (see chart for details).     Wound care discussed Final Clinical Impressions(s) / UC Diagnoses   Final diagnoses:  Abscess of left buttock     Discharge Instructions      Warm compresses may help Take ibuprofen 3 times a day with food.  This will help with pain and swelling Take Augmentin 2 times a day.  This is for the infection The small piece of gauze in the opening will fall out in a day or 2.  It is easily removed Keep a bandage or dressing on it as long as it is draining Call for problems    ED Prescriptions     Medication Sig Dispense Auth. Provider   amoxicillin-clavulanate (AUGMENTIN) 875-125 MG tablet Take 1 tablet by mouth every 12 (twelve) hours. 14 tablet Raylene Everts, MD   ibuprofen (ADVIL) 800 MG tablet Take 1 tablet (800 mg total) by mouth 3 (three) times daily.  21 tablet Raylene Everts, MD      PDMP not reviewed this encounter.   Raylene Everts, MD 10/05/22 [id number]

## 2022-10-05 NOTE — ED Triage Notes (Addendum)
Noticed a place on right buttocks that he and his wife tried expressing and getting a white substance from. Area of redness, tenderness, warmth, and swelling spreading and worsening over the last couple days. Now finding it difficult to walk to due pain in area.

## 2022-10-09 ENCOUNTER — Other Ambulatory Visit: Payer: Self-pay | Admitting: Physician Assistant

## 2022-10-09 DIAGNOSIS — E039 Hypothyroidism, unspecified: Secondary | ICD-10-CM

## 2022-11-01 ENCOUNTER — Telehealth: Payer: Self-pay | Admitting: Emergency Medicine

## 2022-11-01 NOTE — Telephone Encounter (Signed)
Return call to Austin Weeks regarding need for a refill of antibiotic on buttock- pt was seen in March 2024 at [facility] for same. Chart reviewed w/ Dr Delton See. Pt needs to follow up w/ PCP or be evaluated at Urgent Care.

## 2023-01-03 ENCOUNTER — Ambulatory Visit: Payer: 59 | Admitting: Physician Assistant

## 2023-01-03 ENCOUNTER — Encounter: Payer: Self-pay | Admitting: Physician Assistant

## 2023-01-03 VITALS — BP 136/97 | HR 100 | Ht 73.0 in | Wt 214.5 lb

## 2023-01-03 DIAGNOSIS — K2100 Gastro-esophageal reflux disease with esophagitis, without bleeding: Secondary | ICD-10-CM

## 2023-01-03 DIAGNOSIS — F339 Major depressive disorder, recurrent, unspecified: Secondary | ICD-10-CM

## 2023-01-03 DIAGNOSIS — N521 Erectile dysfunction due to diseases classified elsewhere: Secondary | ICD-10-CM

## 2023-01-03 DIAGNOSIS — E039 Hypothyroidism, unspecified: Secondary | ICD-10-CM

## 2023-01-03 DIAGNOSIS — F411 Generalized anxiety disorder: Secondary | ICD-10-CM

## 2023-01-03 DIAGNOSIS — L409 Psoriasis, unspecified: Secondary | ICD-10-CM

## 2023-01-03 DIAGNOSIS — I10 Essential (primary) hypertension: Secondary | ICD-10-CM | POA: Diagnosis not present

## 2023-01-03 DIAGNOSIS — Z794 Long term (current) use of insulin: Secondary | ICD-10-CM

## 2023-01-03 DIAGNOSIS — E1165 Type 2 diabetes mellitus with hyperglycemia: Secondary | ICD-10-CM

## 2023-01-03 DIAGNOSIS — K900 Celiac disease: Secondary | ICD-10-CM

## 2023-01-03 DIAGNOSIS — E1169 Type 2 diabetes mellitus with other specified complication: Secondary | ICD-10-CM | POA: Diagnosis not present

## 2023-01-03 DIAGNOSIS — E785 Hyperlipidemia, unspecified: Secondary | ICD-10-CM

## 2023-01-03 DIAGNOSIS — Z7984 Long term (current) use of oral hypoglycemic drugs: Secondary | ICD-10-CM

## 2023-01-03 DIAGNOSIS — E118 Type 2 diabetes mellitus with unspecified complications: Secondary | ICD-10-CM

## 2023-01-03 LAB — POCT GLYCOSYLATED HEMOGLOBIN (HGB A1C): HbA1c POC (<> result, manual entry): 13.6 % (ref 4.0–5.6)

## 2023-01-03 MED ORDER — VILAZODONE HCL 20 MG PO TABS: 1.0000 | ORAL_TABLET | Freq: Every day | ORAL | 3 refills | Status: DC

## 2023-01-03 MED ORDER — LEVOTHYROXINE SODIUM 175 MCG PO TABS: ORAL_TABLET | ORAL | 0 refills | Status: DC

## 2023-01-03 MED ORDER — ATORVASTATIN CALCIUM 40 MG PO TABS: 40.0000 mg | ORAL_TABLET | Freq: Every day | ORAL | 3 refills | Status: DC

## 2023-01-03 MED ORDER — SILDENAFIL CITRATE 100 MG PO TABS: 100.0000 mg | ORAL_TABLET | Freq: Every day | ORAL | 5 refills | Status: DC | PRN

## 2023-01-03 MED ORDER — FREESTYLE LIBRE 14 DAY SENSOR MISC: 1.0000 "application " | 11 refills | Status: DC

## 2023-01-03 MED ORDER — GLIPIZIDE 5 MG PO TABS: 5.0000 mg | ORAL_TABLET | Freq: Two times a day (BID) | ORAL | 0 refills | Status: DC

## 2023-01-03 MED ORDER — PIOGLITAZONE HCL 15 MG PO TABS: 15.0000 mg | ORAL_TABLET | Freq: Every day | ORAL | 0 refills | Status: DC

## 2023-01-03 MED ORDER — SYNJARDY 12.5-1000 MG PO TABS: 1.0000 | ORAL_TABLET | Freq: Two times a day (BID) | ORAL | 0 refills | Status: DC

## 2023-01-03 MED ORDER — PANTOPRAZOLE SODIUM 40 MG PO TBEC: 40.0000 mg | DELAYED_RELEASE_TABLET | Freq: Every day | ORAL | 3 refills | Status: DC

## 2023-01-03 MED ORDER — IBUPROFEN 800 MG PO TABS: 800.0000 mg | ORAL_TABLET | Freq: Three times a day (TID) | ORAL | 0 refills | Status: DC

## 2023-01-03 MED ORDER — TOUJEO SOLOSTAR 300 UNIT/ML ~~LOC~~ SOPN: 26.0000 [IU] | PEN_INJECTOR | Freq: Every day | SUBCUTANEOUS | 1 refills | Status: DC

## 2023-01-03 MED ORDER — CLOBETASOL PROPIONATE 0.05 % EX CREA: 1.0000 | TOPICAL_CREAM | Freq: Two times a day (BID) | CUTANEOUS | 0 refills | Status: DC

## 2023-01-03 MED ORDER — LISINOPRIL 20 MG PO TABS: 20.0000 mg | ORAL_TABLET | Freq: Every day | ORAL | 1 refills | Status: DC

## 2023-01-03 NOTE — Progress Notes (Signed)
Established Patient Office Visit  Subjective   Patient ID: Austin Weeks, male    DOB: [birth year]  Age: 47 y.o. MRN: [id number]  Chief Complaint  Patient presents with   Diabetes    Patient in office for follow up visit and rx rf's has been out of all meds x 2 weeks.     Diabetes    Patient presents for control of T2DM. States he ran out of all of his medications approx 10 days ago and his BG has been steadily increasing since. He also states that he was partitioning his medications out to make them last longer. States he checks BG bid noticing its 120-150 in am and 250 in pm. Reports he is not active and is trying to find a way to eat that works best for his DM and celiac's. Reports he doesn't eat very often but eats a lot of potatoes, meat, and cheese and often consumes boost drinks. States he has recently started working from home and seeks ways to improve his diet by meal prepping. Patient last had an eye exam 9-77mo ago and is working on scheduling a new exam. His neuropathy has entirely subsided with a gluten-free diet and it only returns if he consumes gluten. Denies CP, SOB, vision changes, dizziness, and hypoglycemic events.  Patient also notes a dark purple spot on the medial aspect of his right great toe leftover from [place]. It has been present approx 7mo. Says it has not turned black, it is not painful, and sensation is intact.  .. Active Ambulatory Problems    Diagnosis Date Noted   Hypothyroidism 01/06/2010   DM 01/06/2010   Dyslipidemia, goal LDL below 70 01/06/2010   OBESITY, UNSPECIFIED 05/15/2009   DEPRESSIVE DISORDER NOT ELSEWHERE CLASSIFIED 05/15/2009   Essential hypertension 01/27/2010   FATIGUE 01/05/2010   WEIGHT GAIN 05/15/2009   SHORTNESS OF BREATH 01/05/2010   CHEST PAIN, LEFT 01/05/2010   TOBACCO USE, QUIT 05/15/2009   Generalized anxiety disorder 04/28/2015   Type 2 diabetes mellitus with complication, with long-term  current use of insulin ([facility]) 04/28/2015   Other specified hypothyroidism 04/28/2015   Depression 05/01/2015   Tooth pain 10/24/2015   Poor dentition 05/04/2016   GAD (generalized anxiety disorder) 06/06/2017   Depression, recurrent ([facility]) 06/06/2017   Erectile dysfunction due to diseases classified elsewhere 06/06/2017   Right inguinal hernia 12/27/2017   Stress at home 03/29/2019   Eosinophils increased 04/24/2019   Acute recurrent pancreatitis 04/24/2019   Gastroesophageal reflux disease with esophagitis 03/24/2021   Uncontrolled type 2 diabetes mellitus with hyperglycemia ([facility]) 03/25/2021   Decreased GFR 03/25/2021   Epigastric pain 03/25/2021   Celiac disease 09/15/2021   Psoriasis 09/15/2021   Dizziness 09/16/2021   Resolved Ambulatory Problems    Diagnosis Date Noted   No Resolved Ambulatory Problems   Past Medical History:  Diagnosis Date   Depressed    Diabetes mellitus without complication ([facility])    Heart murmur    Hypertension      ROS See HPI    Objective:     BP (!) 136/97   Pulse 100   Ht 6\' 1"  (1.854 m)   Wt 214 lb 8 oz (97.3 kg)   SpO2 99%   BMI 28.30 kg/m  BP Readings from Last 3 Encounters:  01/03/23 (!) 136/97  10/05/22 139/87  11/13/21 (!) 146/93   Wt Readings from Last 3 Encounters:  01/03/23 214 lb 8 oz (97.3 kg)  09/15/21 211 lb (95.7 kg)  08/06/21 208 lb 6 oz (94.5 kg)      Physical Exam Constitutional:      Appearance: Normal appearance.  Cardiovascular:     Rate and Rhythm: Normal rate and regular rhythm.     Pulses: Normal pulses.     Heart sounds: Normal heart sounds.  Pulmonary:     Effort: Pulmonary effort is normal.     Breath sounds: Normal breath sounds.  Musculoskeletal:        General: No swelling or tenderness.  Neurological:     General: No focal deficit present.     Mental Status: He is alert and oriented to person, place, and time.      Results for orders placed or performed in visit on 01/03/23  POCT  HgB A1C  Result Value Ref Range   Hemoglobin A1C     HbA1c POC (<> result, manual entry) 13.6 4.0 - 5.6 %   HbA1c, POC (prediabetic range)     HbA1c, POC (controlled diabetic range)          Assessment & Plan:  Marland KitchenMarland KitchenSven was seen today for diabetes.  Diagnoses and all orders for this visit:  Uncontrolled type 2 diabetes mellitus with hyperglycemia (HCC) -     CBC w/Diff/Platelet -     COMPLETE METABOLIC PANEL WITH GFR -     TSH -     Lipid Panel w/reflex Direct LDL -     POCT HgB A1C -     Discontinue: Empagliflozin-metFORMIN HCl (SYNJARDY) 12.11-998 MG TABS; Take 1 tablet by mouth 2 (two) times daily. -     Discontinue: glipiZIDE (GLUCOTROL) 5 MG tablet; Take 1 tablet (5 mg total) by mouth 2 (two) times daily before a meal. -     Discontinue: insulin glargine, 1 Unit Dial, (TOUJEO SOLOSTAR) 300 UNIT/ML Solostar Pen; Inject 26 Units into the skin at bedtime. -     Discontinue: Continuous Glucose Sensor (FREESTYLE LIBRE 14 DAY SENSOR) MISC; 1 application  by Does not apply route every 14 (fourteen) days. Apply upper deltoid every 14 days, use reader to determine blood sugars -     Continuous Glucose Sensor (FREESTYLE LIBRE 14 DAY SENSOR) MISC; 1 application  by Does not apply route every 14 (fourteen) days. Apply upper deltoid every 14 days, use reader to determine blood sugars -     Empagliflozin-metFORMIN HCl (SYNJARDY) 12.11-998 MG TABS; Take 1 tablet by mouth 2 (two) times daily. -     glipiZIDE (GLUCOTROL) 5 MG tablet; Take 1 tablet (5 mg total) by mouth 2 (two) times daily before a meal. -     insulin glargine, 1 Unit Dial, (TOUJEO SOLOSTAR) 300 UNIT/ML Solostar Pen; Inject 26 Units into the skin at bedtime. -     pioglitazone (ACTOS) 15 MG tablet; Take 1 tablet (15 mg total) by mouth daily. -     Ambulatory referral to diabetic education  Type 2 diabetes mellitus with complication (HCC)  Hyperlipidemia associated with type 2 diabetes mellitus ([facility]) -     Discontinue:  atorvastatin (LIPITOR) 40 MG tablet; Take 1 tablet (40 mg total) by mouth daily at 6 PM. -     atorvastatin (LIPITOR) 40 MG tablet; Take 1 tablet (40 mg total) by mouth daily at 6 PM.  Psoriasis -     Discontinue: clobetasol cream (TEMOVATE) 0.05 %; Apply 1 Application topically 2 (two) times daily. -     clobetasol cream (TEMOVATE) 0.05 %; Apply 1 Application  topically 2 (two) times daily.  Acquired hypothyroidism -     Discontinue: levothyroxine (SYNTHROID) 175 MCG tablet; TAKE 1 TABLET BY MOUTH DAILY. -     levothyroxine (SYNTHROID) 175 MCG tablet; TAKE 1 TABLET BY MOUTH DAILY.  Essential hypertension -     Discontinue: lisinopril (ZESTRIL) 20 MG tablet; Take 1 tablet (20 mg total) by mouth daily. -     lisinopril (ZESTRIL) 20 MG tablet; Take 1 tablet (20 mg total) by mouth daily.  Gastroesophageal reflux disease with esophagitis, unspecified whether hemorrhage -     Discontinue: pantoprazole (PROTONIX) 40 MG tablet; Take 1 tablet (40 mg total) by mouth daily. -     pantoprazole (PROTONIX) 40 MG tablet; Take 1 tablet (40 mg total) by mouth daily.  Erectile dysfunction due to diseases classified elsewhere -     Discontinue: sildenafil (VIAGRA) 100 MG tablet; Take 1 tablet (100 mg total) by mouth daily as needed for erectile dysfunction. -     sildenafil (VIAGRA) 100 MG tablet; Take 1 tablet (100 mg total) by mouth daily as needed for erectile dysfunction.  Depression, recurrent (HCC) -     Discontinue: Vilazodone HCl (VIIBRYD) 20 MG TABS; Take 1 tablet (20 mg total) by mouth daily. -     Vilazodone HCl (VIIBRYD) 20 MG TABS; Take 1 tablet (20 mg total) by mouth daily.  GAD (generalized anxiety disorder) -     Discontinue: Vilazodone HCl (VIIBRYD) 20 MG TABS; Take 1 tablet (20 mg total) by mouth daily. -     Vilazodone HCl (VIIBRYD) 20 MG TABS; Take 1 tablet (20 mg total) by mouth daily.  Dyslipidemia, goal LDL below 70 -     Lipid Panel w/reflex Direct LDL  Celiac disease -      Ambulatory referral to diabetic education  Other orders -     Discontinue: ibuprofen (ADVIL) 800 MG tablet; Take 1 tablet (800 mg total) by mouth 3 (three) times daily. -     Discontinue: pioglitazone (ACTOS) 15 MG tablet; Take 1 tablet (15 mg total) by mouth daily. -     ibuprofen (ADVIL) 800 MG tablet; Take 1 tablet (800 mg total) by mouth 3 (three) times daily.   A1c massively elevated to 13.6 Will resume medications as prescribed Will start actos.  Will start CGM. Referred to certified diabetes educator. Discussed healthy eating strategies, low carb diet, and awareness of glycemic index. Discussed increasing daily activity including walking Foot exam UTD- no concern with black spot on foot it is healing nicely Eye exam due; patient will schedule  Continue gluten free diet for Celiac.  Will obtain labs in 37mo once medication use is consistent.  Spent 55 minutes with patient with counseling regarding medications and diet.   Return in about 3 months (around 04/05/2023).    Tandy Gaw, PA-C

## 2023-01-03 NOTE — Patient Instructions (Addendum)
Libre to monitor glucose Restart all medications Add actos 15mg  daily Will refer to diabetic educator Recheck in 3 months

## 2023-03-30 ENCOUNTER — Ambulatory Visit: Payer: 59 | Admitting: Registered"

## 2023-04-05 ENCOUNTER — Ambulatory Visit: Payer: 59 | Admitting: Physician Assistant

## 2023-05-11 ENCOUNTER — Ambulatory Visit: Payer: 59 | Admitting: Physician Assistant

## 2023-06-15 ENCOUNTER — Other Ambulatory Visit: Payer: Self-pay | Admitting: Physician Assistant

## 2023-06-15 DIAGNOSIS — E1165 Type 2 diabetes mellitus with hyperglycemia: Secondary | ICD-10-CM

## 2023-07-11 ENCOUNTER — Ambulatory Visit (INDEPENDENT_AMBULATORY_CARE_PROVIDER_SITE_OTHER): Payer: 59 | Admitting: Physician Assistant

## 2023-07-11 ENCOUNTER — Encounter: Payer: Self-pay | Admitting: Physician Assistant

## 2023-07-11 VITALS — BP 126/87 | HR 92 | Resp 14 | Ht 73.0 in | Wt 233.1 lb

## 2023-07-11 DIAGNOSIS — Z23 Encounter for immunization: Secondary | ICD-10-CM | POA: Diagnosis not present

## 2023-07-11 DIAGNOSIS — N521 Erectile dysfunction due to diseases classified elsewhere: Secondary | ICD-10-CM

## 2023-07-11 DIAGNOSIS — Z1211 Encounter for screening for malignant neoplasm of colon: Secondary | ICD-10-CM

## 2023-07-11 DIAGNOSIS — E1169 Type 2 diabetes mellitus with other specified complication: Secondary | ICD-10-CM

## 2023-07-11 DIAGNOSIS — E1165 Type 2 diabetes mellitus with hyperglycemia: Secondary | ICD-10-CM | POA: Diagnosis not present

## 2023-07-11 DIAGNOSIS — G629 Polyneuropathy, unspecified: Secondary | ICD-10-CM

## 2023-07-11 DIAGNOSIS — E785 Hyperlipidemia, unspecified: Secondary | ICD-10-CM

## 2023-07-11 DIAGNOSIS — F411 Generalized anxiety disorder: Secondary | ICD-10-CM

## 2023-07-11 DIAGNOSIS — F339 Major depressive disorder, recurrent, unspecified: Secondary | ICD-10-CM

## 2023-07-11 DIAGNOSIS — Z7984 Long term (current) use of oral hypoglycemic drugs: Secondary | ICD-10-CM

## 2023-07-11 DIAGNOSIS — E039 Hypothyroidism, unspecified: Secondary | ICD-10-CM

## 2023-07-11 DIAGNOSIS — I10 Essential (primary) hypertension: Secondary | ICD-10-CM

## 2023-07-11 DIAGNOSIS — K2100 Gastro-esophageal reflux disease with esophagitis, without bleeding: Secondary | ICD-10-CM

## 2023-07-11 LAB — POCT GLYCOSYLATED HEMOGLOBIN (HGB A1C): Hemoglobin A1C: 11.6 % — AB (ref 4.0–5.6)

## 2023-07-11 MED ORDER — PIOGLITAZONE HCL 30 MG PO TABS: 30.0000 mg | ORAL_TABLET | Freq: Every day | ORAL | 3 refills | Status: DC

## 2023-07-11 MED ORDER — SYNJARDY 12.5-1000 MG PO TABS: 1.0000 | ORAL_TABLET | Freq: Two times a day (BID) | ORAL | 0 refills | Status: DC

## 2023-07-11 MED ORDER — ATORVASTATIN CALCIUM 40 MG PO TABS: 40.0000 mg | ORAL_TABLET | Freq: Every day | ORAL | 3 refills | Status: DC

## 2023-07-11 MED ORDER — LISINOPRIL 20 MG PO TABS: 20.0000 mg | ORAL_TABLET | Freq: Every day | ORAL | 1 refills | Status: DC

## 2023-07-11 MED ORDER — TOUJEO SOLOSTAR 300 UNIT/ML ~~LOC~~ SOPN: PEN_INJECTOR | SUBCUTANEOUS | 0 refills | Status: DC

## 2023-07-11 MED ORDER — GABAPENTIN 100 MG PO CAPS: ORAL_CAPSULE | ORAL | 1 refills | Status: DC

## 2023-07-11 MED ORDER — VILAZODONE HCL 20 MG PO TABS: 1.0000 | ORAL_TABLET | Freq: Every day | ORAL | 3 refills | Status: DC

## 2023-07-11 MED ORDER — SILDENAFIL CITRATE 100 MG PO TABS: 100.0000 mg | ORAL_TABLET | Freq: Every day | ORAL | 5 refills | Status: DC | PRN

## 2023-07-11 MED ORDER — LEVOTHYROXINE SODIUM 175 MCG PO TABS: ORAL_TABLET | ORAL | 0 refills | Status: DC

## 2023-07-11 MED ORDER — TOUJEO SOLOSTAR 300 UNIT/ML ~~LOC~~ SOPN
PEN_INJECTOR | SUBCUTANEOUS | 0 refills | Status: DC
Start: 2023-07-11 — End: 2023-10-10

## 2023-07-11 MED ORDER — GLIPIZIDE 10 MG PO TABS: 10.0000 mg | ORAL_TABLET | Freq: Two times a day (BID) | ORAL | 0 refills | Status: DC

## 2023-07-11 MED ORDER — IBUPROFEN 800 MG PO TABS: 800.0000 mg | ORAL_TABLET | Freq: Three times a day (TID) | ORAL | 0 refills | Status: DC

## 2023-07-11 MED ORDER — PANTOPRAZOLE SODIUM 40 MG PO TBEC: 40.0000 mg | DELAYED_RELEASE_TABLET | Freq: Every day | ORAL | 3 refills | Status: DC

## 2023-07-11 NOTE — Patient Instructions (Signed)
Increased glipizide to 10mg  twice a day Increased actos to 30mg  daily Increased insulin to 22 units at night and 10 units in the morning Continue syndjardy

## 2023-07-11 NOTE — Progress Notes (Signed)
Established Patient Office Visit  Subjective   Patient ID: Austin Weeks, male    DOB: 13-Oct-1975  Age: 47 y.o. MRN: [id number]  Chief Complaint  Patient presents with   Medication Refill    HPI Pt is a 47 yo obese male with T2DM, Hypothyroidism, HTN, HLD, GERD, ED, MDD, GAD who presents to the clinic for follow up.   He did miss his 3 month follow up. He is checking his sugars with sensors. He denies any hypoglycemic events. In the morning sugars are 120's to 150s most of the time and 200s during the day with spikes of up to 300 at the most. He is compliant with medication and only takes toujeo at bedtime. Denies any CP, palpitations, headaches or vision changes. Pt does not exercise.   His mood is good. No concerns.   GERD controlled on medication no concerns.     .. Active Ambulatory Problems    Diagnosis Date Noted   Hypothyroidism 01/06/2010   DM 01/06/2010   Dyslipidemia, goal LDL below 70 01/06/2010   OBESITY, UNSPECIFIED 05/15/2009   DEPRESSIVE DISORDER NOT ELSEWHERE CLASSIFIED 05/15/2009   Essential hypertension 01/27/2010   FATIGUE 01/05/2010   WEIGHT GAIN 05/15/2009   SHORTNESS OF BREATH 01/05/2010   CHEST PAIN, LEFT 01/05/2010   TOBACCO USE, QUIT 05/15/2009   Generalized anxiety disorder 04/28/2015   Type 2 diabetes mellitus with complication, with long-term current use of insulin (HCC) 04/28/2015   Other specified hypothyroidism 04/28/2015   Depression 05/01/2015   Tooth pain 10/24/2015   Poor dentition 05/04/2016   GAD (generalized anxiety disorder) 06/06/2017   Depression, recurrent (HCC) 06/06/2017   Erectile dysfunction due to diseases classified elsewhere 06/06/2017   Right inguinal hernia 12/27/2017   Stress at home 03/29/2019   Eosinophils increased 04/24/2019   Acute recurrent pancreatitis 04/24/2019   Gastroesophageal reflux disease with esophagitis 03/24/2021   Uncontrolled type 2 diabetes mellitus with hyperglycemia (HCC) 03/25/2021    Decreased GFR 03/25/2021   Epigastric pain 03/25/2021   Celiac disease 09/15/2021   Psoriasis 09/15/2021   Dizziness 09/16/2021   Hyperlipidemia associated with type 2 diabetes mellitus (HCC) 07/12/2023   Neuropathy 07/12/2023   Resolved Ambulatory Problems    Diagnosis Date Noted   No Resolved Ambulatory Problems   Past Medical History:  Diagnosis Date   Depressed    Diabetes mellitus without complication (HCC)    Heart murmur    Hypertension      ROS See HPI.    Objective:     BP 126/87 (BP Location: Right Arm, Patient Position: Sitting)   Pulse 92   Resp 14   Ht 6\' 1"  (1.854 m)   Wt 233 lb 1.6 oz (105.7 kg)   SpO2 98%   BMI 30.75 kg/m  BP Readings from Last 3 Encounters:  07/11/23 126/87  01/03/23 (!) 136/97  10/05/22 139/87   Wt Readings from Last 3 Encounters:  07/11/23 233 lb 1.6 oz (105.7 kg)  01/03/23 214 lb 8 oz (97.3 kg)  09/15/21 211 lb (95.7 kg)     ..    07/12/2023    7:22 AM 01/03/2023   11:46 AM 01/03/2023   10:48 AM 09/15/2021    4:39 PM 07/22/2021    3:54 PM  Depression screen PHQ 2/9  Decreased Interest 0 1 1 1  0  Down, Depressed, Hopeless 0 1 1 1  0  PHQ - 2 Score 0 2 2 2  0  Altered sleeping 0 0  2  Tired, decreased energy 1 1  2    Change in appetite 0 2  0   Feeling bad or failure about yourself  0 1  0   Trouble concentrating 0 0  2   Moving slowly or fidgety/restless 0 0  0   Suicidal thoughts 0 0  0   PHQ-9 Score 1 6  8    Difficult doing work/chores Not difficult at all Somewhat difficult  Somewhat difficult    ..    07/12/2023    7:22 AM 01/03/2023   11:47 AM 09/15/2021    4:40 PM 05/29/2021    4:17 PM  GAD 7 : Generalized Anxiety Score  Nervous, Anxious, on Edge 0 1 1 1   Control/stop worrying 0 1 0 1  Worry too much - different things 1 1 1 1   Trouble relaxing 0 0 0 0  Restless 0 0 0 0  Easily annoyed or irritable 1 2 2 1   Afraid - awful might happen 0 0 0 0  Total GAD 7 Score 2 5 4 4   Anxiety Difficulty Not  difficult at all Somewhat difficult Not difficult at all Somewhat difficult      Physical Exam Constitutional:      Appearance: Normal appearance. He is obese.  HENT:     Head: Normocephalic.  Cardiovascular:     Rate and Rhythm: Normal rate and regular rhythm.  Pulmonary:     Effort: Pulmonary effort is normal.     Breath sounds: Normal breath sounds.  Musculoskeletal:     Cervical back: Normal range of motion and neck supple. No tenderness.     Right lower leg: No edema.     Left lower leg: No edema.  Lymphadenopathy:     Cervical: No cervical adenopathy.  Neurological:     General: No focal deficit present.     Mental Status: He is alert and oriented to person, place, and time.  Psychiatric:        Mood and Affect: Mood normal.    .. Lab Results  Component Value Date   HGBA1C 11.6 (A) 07/11/2023    .Marland Kitchen Diabetic Foot Exam - Simple   Simple Foot Form Visual Inspection No deformities, no ulcerations, no other skin breakdown bilaterally: Yes Sensation Testing See comments: Yes Pulse Check Posterior Tibialis and Dorsalis pulse intact bilaterally: Yes Comments Some areas effected by neuropathy on ball of bilateral feet.         Assessment & Plan:  Marland KitchenMarland KitchenFransisco was seen today for medication refill.  Diagnoses and all orders for this visit:  Uncontrolled type 2 diabetes mellitus with hyperglycemia (HCC) -     POCT HgB A1C -     Discontinue: insulin glargine, 1 Unit Dial, (TOUJEO SOLOSTAR) 300 UNIT/ML Solostar Pen; Take 22 units at bedtime and 10 units in the morning. -     Lipid panel -     CMP14+EGFR -     TSH -     CBC w/Diff/Platelet -     Empagliflozin-metFORMIN HCl (SYNJARDY) 12.11-998 MG TABS; Take 1 tablet by mouth 2 (two) times daily. -     glipiZIDE (GLUCOTROL) 10 MG tablet; Take 1 tablet (10 mg total) by mouth 2 (two) times daily before a meal. -     insulin glargine, 1 Unit Dial, (TOUJEO SOLOSTAR) 300 UNIT/ML Solostar Pen; Take 22 units at bedtime and  10 units in the morning. -     pioglitazone (ACTOS) 30 MG tablet; Take 1 tablet (30 mg total)  by mouth daily.  Need for influenza vaccination -     Flu vaccine trivalent PF, 6mos and older(Flulaval,Afluria,Fluarix,Fluzone)  Essential hypertension -     lisinopril (ZESTRIL) 20 MG tablet; Take 1 tablet (20 mg total) by mouth daily.  Hypothyroidism, unspecified type -     TSH  Dyslipidemia, goal LDL below 70 -     Lipid panel  Hyperlipidemia associated with type 2 diabetes mellitus (HCC) -     atorvastatin (LIPITOR) 40 MG tablet; Take 1 tablet (40 mg total) by mouth daily at 6 PM.  Neuropathy -     gabapentin (NEURONTIN) 100 MG capsule; Take 1-3 tablets as needed up to three times a day. -     ibuprofen (ADVIL) 800 MG tablet; Take 1 tablet (800 mg total) by mouth 3 (three) times daily.  Acquired hypothyroidism -     levothyroxine (SYNTHROID) 175 MCG tablet; TAKE 1 TABLET BY MOUTH DAILY.  Gastroesophageal reflux disease with esophagitis, unspecified whether hemorrhage -     pantoprazole (PROTONIX) 40 MG tablet; Take 1 tablet (40 mg total) by mouth daily.  Erectile dysfunction due to diseases classified elsewhere -     sildenafil (VIAGRA) 100 MG tablet; Take 1 tablet (100 mg total) by mouth daily as needed for erectile dysfunction.  Depression, recurrent (HCC) -     Vilazodone HCl (VIIBRYD) 20 MG TABS; Take 1 tablet (20 mg total) by mouth daily.  GAD (generalized anxiety disorder) -     Vilazodone HCl (VIIBRYD) 20 MG TABS; Take 1 tablet (20 mg total) by mouth daily.  Colon cancer screening -     Ambulatory referral to Gastroenterology  Need for Tdap vaccination -     Tdap vaccine greater than or equal to 7yo IM  Other orders -     Discontinue: glipiZIDE (GLUCOTROL) 10 MG tablet; Take 1 tablet (10 mg total) by mouth 2 (two) times daily before a meal. -     Discontinue: pioglitazone (ACTOS) 30 MG tablet; Take 1 tablet (30 mg total) by mouth daily.   A1C not controlled but  improved from last recheck Continue to monitor glucose closely  Increased actos to 30mg , increased glipizide to 10mg  bid, continue synjardy, increased toujeo to 22 units at night and 10 units in the morning On statin On ACE, BP to goal Foot exam done today Needs eye exam Labs ordered today Tdap and flu shot given today without complication.   Colonoscopy ordered today.   Protonix refilled for GERD.   Recheck TSH for medication adjustment.   Return in about 3 months (around 10/09/2023).    Tandy Gaw, PA-C

## 2023-07-12 ENCOUNTER — Encounter: Payer: Self-pay | Admitting: Physician Assistant

## 2023-07-12 DIAGNOSIS — E785 Hyperlipidemia, unspecified: Secondary | ICD-10-CM | POA: Insufficient documentation

## 2023-07-12 DIAGNOSIS — G629 Polyneuropathy, unspecified: Secondary | ICD-10-CM | POA: Insufficient documentation

## 2023-07-12 DIAGNOSIS — E039 Hypothyroidism, unspecified: Secondary | ICD-10-CM

## 2023-07-12 LAB — CBC WITH DIFFERENTIAL/PLATELET
Basophils Absolute: 0.1 10*3/uL (ref 0.0–0.2)
Basos: 1 %
EOS (ABSOLUTE): 0.5 10*3/uL — ABNORMAL HIGH (ref 0.0–0.4)
Eos: 5 %
Hematocrit: 55.4 % — ABNORMAL HIGH (ref 37.5–51.0)
Hemoglobin: 18.5 g/dL — ABNORMAL HIGH (ref 13.0–17.7)
Immature Grans (Abs): 0 10*3/uL (ref 0.0–0.1)
Immature Granulocytes: 0 %
Lymphocytes Absolute: 1.8 10*3/uL (ref 0.7–3.1)
Lymphs: 22 %
MCH: 29.3 pg (ref 26.6–33.0)
MCHC: 33.4 g/dL (ref 31.5–35.7)
MCV: 88 fL (ref 79–97)
Monocytes Absolute: 0.3 10*3/uL (ref 0.1–0.9)
Monocytes: 4 %
Neutrophils Absolute: 5.6 10*3/uL (ref 1.4–7.0)
Neutrophils: 68 %
Platelets: 248 10*3/uL (ref 150–450)
RBC: 6.32 x10E6/uL — ABNORMAL HIGH (ref 4.14–5.80)
RDW: 13.3 % (ref 11.6–15.4)
WBC: 8.3 10*3/uL (ref 3.4–10.8)

## 2023-07-12 LAB — CMP14+EGFR
ALT: 69 [IU]/L — ABNORMAL HIGH (ref 0–44)
AST: 66 [IU]/L — ABNORMAL HIGH (ref 0–40)
Albumin: 5 g/dL (ref 4.1–5.1)
Alkaline Phosphatase: 129 [IU]/L — ABNORMAL HIGH (ref 44–121)
BUN/Creatinine Ratio: 14 (ref 9–20)
BUN: 20 mg/dL (ref 6–24)
Bilirubin Total: 0.4 mg/dL (ref 0.0–1.2)
CO2: 20 mmol/L (ref 20–29)
Calcium: 10.3 mg/dL — ABNORMAL HIGH (ref 8.7–10.2)
Chloride: 98 mmol/L (ref 96–106)
Creatinine, Ser: 1.43 mg/dL — ABNORMAL HIGH (ref 0.76–1.27)
Globulin, Total: 2.9 g/dL (ref 1.5–4.5)
Glucose: 161 mg/dL — ABNORMAL HIGH (ref 70–99)
Potassium: 4.6 mmol/L (ref 3.5–5.2)
Sodium: 137 mmol/L (ref 134–144)
Total Protein: 7.9 g/dL (ref 6.0–8.5)
eGFR: 62 mL/min/{1.73_m2} (ref >59)

## 2023-07-12 LAB — TSH: TSH: 313 u[IU]/mL — ABNORMAL HIGH (ref 0.450–4.500)

## 2023-07-12 LAB — LIPID PANEL
Chol/HDL Ratio: 6.2 {ratio} — ABNORMAL HIGH (ref 0.0–5.0)
Cholesterol, Total: 240 mg/dL — ABNORMAL HIGH (ref 100–199)
HDL: 39 mg/dL — ABNORMAL LOW (ref >39)
LDL Chol Calc (NIH): 148 mg/dL — ABNORMAL HIGH (ref 0–99)
Triglycerides: 290 mg/dL — ABNORMAL HIGH (ref 0–149)
VLDL Cholesterol Cal: 53 mg/dL — ABNORMAL HIGH (ref 5–40)

## 2023-07-12 MED ORDER — ATORVASTATIN CALCIUM 80 MG PO TABS: 80.0000 mg | ORAL_TABLET | Freq: Every day | ORAL | 3 refills | Status: DC

## 2023-07-12 MED ORDER — LEVOTHYROXINE SODIUM 200 MCG PO TABS: 200.0000 ug | ORAL_TABLET | Freq: Every day | ORAL | 1 refills | Status: DC

## 2023-07-12 NOTE — Progress Notes (Signed)
Austin Weeks,   Are you taking your lipitor?  LDL goal is under 70.   Liver enzymes up.   TSH is very high!! Are you taking your thyroid medication at all?   Your hemoglobin is very high and blood thick.   Your not smoking correct?

## 2023-10-10 ENCOUNTER — Ambulatory Visit (INDEPENDENT_AMBULATORY_CARE_PROVIDER_SITE_OTHER): Payer: 59 | Admitting: Physician Assistant

## 2023-10-10 ENCOUNTER — Encounter: Payer: Self-pay | Admitting: Physician Assistant

## 2023-10-10 VITALS — BP 122/78 | HR 70 | Wt 233.0 lb

## 2023-10-10 DIAGNOSIS — I10 Essential (primary) hypertension: Secondary | ICD-10-CM

## 2023-10-10 DIAGNOSIS — Z23 Encounter for immunization: Secondary | ICD-10-CM | POA: Diagnosis not present

## 2023-10-10 DIAGNOSIS — E785 Hyperlipidemia, unspecified: Secondary | ICD-10-CM

## 2023-10-10 DIAGNOSIS — E1165 Type 2 diabetes mellitus with hyperglycemia: Secondary | ICD-10-CM

## 2023-10-10 DIAGNOSIS — E162 Hypoglycemia, unspecified: Secondary | ICD-10-CM

## 2023-10-10 DIAGNOSIS — L409 Psoriasis, unspecified: Secondary | ICD-10-CM

## 2023-10-10 DIAGNOSIS — K2100 Gastro-esophageal reflux disease with esophagitis, without bleeding: Secondary | ICD-10-CM

## 2023-10-10 DIAGNOSIS — R809 Proteinuria, unspecified: Secondary | ICD-10-CM | POA: Insufficient documentation

## 2023-10-10 DIAGNOSIS — F339 Major depressive disorder, recurrent, unspecified: Secondary | ICD-10-CM

## 2023-10-10 DIAGNOSIS — E039 Hypothyroidism, unspecified: Secondary | ICD-10-CM

## 2023-10-10 DIAGNOSIS — F411 Generalized anxiety disorder: Secondary | ICD-10-CM

## 2023-10-10 DIAGNOSIS — N521 Erectile dysfunction due to diseases classified elsewhere: Secondary | ICD-10-CM

## 2023-10-10 DIAGNOSIS — Z7984 Long term (current) use of oral hypoglycemic drugs: Secondary | ICD-10-CM

## 2023-10-10 DIAGNOSIS — G629 Polyneuropathy, unspecified: Secondary | ICD-10-CM

## 2023-10-10 DIAGNOSIS — Z7985 Long-term (current) use of injectable non-insulin antidiabetic drugs: Secondary | ICD-10-CM

## 2023-10-10 LAB — POCT GLYCOSYLATED HEMOGLOBIN (HGB A1C): Hemoglobin A1C: 7.3 % — AB (ref 4.0–5.6)

## 2023-10-10 LAB — POCT UA - MICROALBUMIN
Albumin/Creatinine Ratio, Urine, POC: >300
Creatinine, POC: 100 mg/dL
Microalbumin Ur, POC: 150 mg/L

## 2023-10-10 MED ORDER — PANTOPRAZOLE SODIUM 40 MG PO TBEC: 40.0000 mg | DELAYED_RELEASE_TABLET | Freq: Every day | ORAL | 3 refills | Status: AC

## 2023-10-10 MED ORDER — GLIPIZIDE 10 MG PO TABS: 10.0000 mg | ORAL_TABLET | Freq: Two times a day (BID) | ORAL | 0 refills | Status: DC

## 2023-10-10 MED ORDER — IBUPROFEN 800 MG PO TABS: 800.0000 mg | ORAL_TABLET | Freq: Three times a day (TID) | ORAL | 0 refills | Status: DC

## 2023-10-10 MED ORDER — VILAZODONE HCL 20 MG PO TABS: 1.0000 | ORAL_TABLET | Freq: Every day | ORAL | 3 refills | Status: DC

## 2023-10-10 MED ORDER — GABAPENTIN 100 MG PO CAPS
ORAL_CAPSULE | ORAL | 1 refills | Status: DC
Start: 2023-10-10 — End: 2024-04-20

## 2023-10-10 MED ORDER — LEVOTHYROXINE SODIUM 175 MCG PO TABS: ORAL_TABLET | ORAL | 0 refills | Status: DC

## 2023-10-10 MED ORDER — SYNJARDY 12.5-1000 MG PO TABS: 1.0000 | ORAL_TABLET | Freq: Two times a day (BID) | ORAL | 0 refills | Status: DC

## 2023-10-10 MED ORDER — PIOGLITAZONE HCL 30 MG PO TABS: 30.0000 mg | ORAL_TABLET | Freq: Every day | ORAL | 3 refills | Status: DC

## 2023-10-10 MED ORDER — TOUJEO SOLOSTAR 300 UNIT/ML ~~LOC~~ SOPN: PEN_INJECTOR | SUBCUTANEOUS | 0 refills | Status: DC

## 2023-10-10 MED ORDER — FREESTYLE LIBRE 14 DAY SENSOR MISC
1.0000 | 11 refills | Status: AC
Start: 2023-10-10 — End: ?

## 2023-10-10 MED ORDER — SILDENAFIL CITRATE 100 MG PO TABS: 100.0000 mg | ORAL_TABLET | Freq: Every day | ORAL | 5 refills | Status: AC | PRN

## 2023-10-10 MED ORDER — ATORVASTATIN CALCIUM 80 MG PO TABS: 80.0000 mg | ORAL_TABLET | Freq: Every day | ORAL | 3 refills | Status: AC

## 2023-10-10 MED ORDER — CLOBETASOL PROPIONATE 0.05 % EX CREA: 1.0000 | TOPICAL_CREAM | Freq: Two times a day (BID) | CUTANEOUS | 0 refills | Status: AC

## 2023-10-10 MED ORDER — LEVOTHYROXINE SODIUM 200 MCG PO TABS: 200.0000 ug | ORAL_TABLET | Freq: Every day | ORAL | 1 refills | Status: DC

## 2023-10-10 MED ORDER — LISINOPRIL 20 MG PO TABS: 20.0000 mg | ORAL_TABLET | Freq: Every day | ORAL | 1 refills | Status: DC

## 2023-10-10 NOTE — Progress Notes (Unsigned)
   Established Patient Office Visit  Subjective   Patient ID: Austin Weeks, male    DOB: [birth year]  Age: 48 y.o. MRN: [id number]  No chief complaint on file.   HPI  50 or 60s once a week max 2   20 units  at night   10 units   {History (Optional):23778}  ROS    Objective:     There were no vitals taken for this visit. BP Readings from Last 3 Encounters:  07/11/23 126/87  01/03/23 (!) 136/97  10/05/22 139/87   Wt Readings from Last 3 Encounters:  07/11/23 233 lb 1.6 oz (105.7 kg)  01/03/23 214 lb 8 oz (97.3 kg)  09/15/21 211 lb (95.7 kg)      Physical Exam   Results for orders placed or performed in visit on 10/10/23  POCT HgB A1C  Result Value Ref Range   Hemoglobin A1C 7.3 (A) 4.0 - 5.6 %   HbA1c POC (<> result, manual entry)     HbA1c, POC (prediabetic range)     HbA1c, POC (controlled diabetic range)        The 10-year ASCVD risk score (Arnett DK, et al., 2019) is: 8.1%    Assessment & Plan:      Pt declined colonoscopy and/or cologuard today but will consider in future.    Return in about 3 months (around 01/10/2024).    Tandy Gaw, PA-C

## 2023-10-10 NOTE — Patient Instructions (Addendum)
 Decrease night time insulin to 18units and increase morning insulin 12 units.  Refilled medications.

## 2023-10-11 ENCOUNTER — Encounter: Payer: Self-pay | Admitting: Physician Assistant

## 2024-01-10 ENCOUNTER — Ambulatory Visit (INDEPENDENT_AMBULATORY_CARE_PROVIDER_SITE_OTHER): Admitting: Physician Assistant

## 2024-01-10 ENCOUNTER — Encounter: Payer: Self-pay | Admitting: Physician Assistant

## 2024-01-10 VITALS — BP 131/90 | HR 91 | Ht 73.0 in | Wt 224.0 lb

## 2024-01-10 DIAGNOSIS — F411 Generalized anxiety disorder: Secondary | ICD-10-CM | POA: Diagnosis not present

## 2024-01-10 DIAGNOSIS — F339 Major depressive disorder, recurrent, unspecified: Secondary | ICD-10-CM | POA: Diagnosis not present

## 2024-01-10 DIAGNOSIS — I10 Essential (primary) hypertension: Secondary | ICD-10-CM | POA: Diagnosis not present

## 2024-01-10 DIAGNOSIS — E039 Hypothyroidism, unspecified: Secondary | ICD-10-CM

## 2024-01-10 DIAGNOSIS — E1165 Type 2 diabetes mellitus with hyperglycemia: Secondary | ICD-10-CM | POA: Diagnosis not present

## 2024-01-10 DIAGNOSIS — Z7984 Long term (current) use of oral hypoglycemic drugs: Secondary | ICD-10-CM

## 2024-01-10 LAB — POCT GLYCOSYLATED HEMOGLOBIN (HGB A1C): Hemoglobin A1C: 6.9 % — AB (ref 4.0–5.6)

## 2024-01-10 MED ORDER — PIOGLITAZONE HCL 30 MG PO TABS: 30.0000 mg | ORAL_TABLET | Freq: Every day | ORAL | 1 refills | Status: AC

## 2024-01-10 MED ORDER — SYNJARDY 12.5-1000 MG PO TABS: 1.0000 | ORAL_TABLET | Freq: Two times a day (BID) | ORAL | 1 refills | Status: AC

## 2024-01-10 MED ORDER — FREESTYLE LIBRE 14 DAY SENSOR MISC: 1.0000 | 1 refills | Status: AC

## 2024-01-10 MED ORDER — LISINOPRIL 20 MG PO TABS: 20.0000 mg | ORAL_TABLET | Freq: Every day | ORAL | 1 refills | Status: AC

## 2024-01-10 MED ORDER — GLIPIZIDE 10 MG PO TABS
10.0000 mg | ORAL_TABLET | Freq: Two times a day (BID) | ORAL | 1 refills | Status: AC
Start: 2024-01-10 — End: ?

## 2024-01-10 NOTE — Progress Notes (Signed)
 Established Patient Office Visit  Subjective   Patient ID: Austin Weeks, male    DOB: 07-25-1976  Age: 48 y.o. MRN: [id number]  Chief Complaint  Patient presents with   Medical Management of Chronic Issues    3 mo fup on d/m  last A1c 7.3    HPI Pt is a 48 yo male with T2DM, HTN, hypothyroidism, MDD, GAD, HLD, Celiac disease who presents to the clinic to follow up and get medications refilled.   Pt continues to do well. His last A1c ws 7.3. he wears a libre plus and loving it. Most readings are in the green zone. He has had some night time lows around 40-50s. He has not been taking toujeo  every night but just as needed.He continues to take synjardy , actos , glipizide  daily. He is working out in [employer] with strength training and that has really dropped his sugars. He feels so much stronger. He has good energy. He has lost another 6lbs since last follow up. He denies any CP, palpitations, headaches or vision changes. He is taking is lisinopril  daily.    Review of Systems  All other systems reviewed and are negative.     Objective:     BP (!) 131/90   Pulse 91   Ht 6' 1 (1.854 m)   Wt 224 lb (101.6 kg)   SpO2 99%   BMI 29.55 kg/m  BP Readings from Last 3 Encounters:  01/10/24 (!) 131/90  10/10/23 122/78  07/11/23 126/87   Wt Readings from Last 3 Encounters:  01/10/24 224 lb (101.6 kg)  10/10/23 233 lb (105.7 kg)  07/11/23 233 lb 1.6 oz (105.7 kg)   ..    01/11/2024    6:54 AM 10/10/2023    4:00 PM 07/12/2023    7:22 AM 01/03/2023   11:46 AM 01/03/2023   10:48 AM  Depression screen PHQ 2/9  Decreased Interest 0 0 0 1 1  Down, Depressed, Hopeless 0 1 0 1 1  PHQ - 2 Score 0 1 0 2 2  Altered sleeping 0 1 0 0   Tired, decreased energy 1 0 1 1   Change in appetite 0 0 0 2   Feeling bad or failure about yourself  0 1 0 1   Trouble concentrating 0 0 0 0   Moving slowly or fidgety/restless 0  0 0   Suicidal thoughts 0 0 0 0   PHQ-9 Score 1 3 1 6    Difficult doing  work/chores Not difficult at all Not difficult at all Not difficult at all Somewhat difficult        Physical Exam Constitutional:      Appearance: Normal appearance.  HENT:     Head: Normocephalic.   Cardiovascular:     Rate and Rhythm: Normal rate and regular rhythm.  Pulmonary:     Effort: Pulmonary effort is normal.   Musculoskeletal:     Right lower leg: No edema.     Left lower leg: No edema.   Neurological:     General: No focal deficit present.     Mental Status: He is alert and oriented to person, place, and time.   Psychiatric:        Mood and Affect: Mood normal.     .. Results for orders placed or performed in visit on 01/10/24  POCT HgB A1C   Collection Time: 01/10/24  3:33 PM  Result Value Ref Range   Hemoglobin A1C 6.9 (A) 4.0 -  5.6 %   HbA1c POC (<> result, manual entry)     HbA1c, POC (prediabetic range)     HbA1c, POC (controlled diabetic range)      The 10-year ASCVD risk score (Arnett DK, et al., 2019) is: 8.6%    Assessment & Plan:  Aaron AasAaron AasGuerin was seen today for medical management of chronic issues.  Diagnoses and all orders for this visit:  Uncontrolled type 2 diabetes mellitus with hyperglycemia (HCC) -     Empagliflozin-metFORMIN  HCl (SYNJARDY ) 12.11-998 MG TABS; Take 1 tablet by mouth 2 (two) times daily. -     glipiZIDE  (GLUCOTROL ) 10 MG tablet; Take 1 tablet (10 mg total) by mouth 2 (two) times daily before a meal. -     pioglitazone  (ACTOS ) 30 MG tablet; Take 1 tablet (30 mg total) by mouth daily. -     POCT HgB A1C -     CMP14+EGFR -     Continuous Glucose Sensor (FREESTYLE LIBRE 14 DAY SENSOR) MISC; 1 application  by Does not apply route every 14 (fourteen) days. Apply upper deltoid every 14 days, use reader to determine blood sugars  Acquired hypothyroidism  Essential hypertension -     lisinopril  (ZESTRIL ) 20 MG tablet; Take 1 tablet (20 mg total) by mouth daily.  Depression, recurrent (HCC)  GAD (generalized anxiety  disorder)   A1C to goal under 7.0 Stop toujeo  since not taking regularly and having some night time lows Consider eating protein/carb combination right before bed Continue synjardy , glipizide , actos . Continue CGM to monitor sugars.  Keep up the good work with diet and exercise.  BP not to goal, continue lisinopril .  Microablumuria, check cmp today On SGLT-2 and ACE for kidney protection On statin Needs eye exam Foot exam UTD Vaccines UTD.  Follow up in 3 months  PHQ no concerns Continue viibryd   TSH to goal Continue synthroid    Return in about 3 months (around 04/11/2024).    Jakaria Lavergne, PA-C

## 2024-01-10 NOTE — Patient Instructions (Signed)
 Need EYE exam.

## 2024-01-11 ENCOUNTER — Encounter: Payer: Self-pay | Admitting: Physician Assistant

## 2024-01-11 ENCOUNTER — Ambulatory Visit: Payer: Self-pay | Admitting: Physician Assistant

## 2024-01-11 DIAGNOSIS — E039 Hypothyroidism, unspecified: Secondary | ICD-10-CM

## 2024-01-11 LAB — CMP14+EGFR
ALT: 64 IU/L — ABNORMAL HIGH (ref 0–44)
AST: 33 IU/L (ref 0–40)
Albumin: 4.5 g/dL (ref 4.1–5.1)
Alkaline Phosphatase: 127 IU/L — ABNORMAL HIGH (ref 44–121)
BUN/Creatinine Ratio: 14 (ref 9–20)
BUN: 15 mg/dL (ref 6–24)
Bilirubin Total: 0.3 mg/dL (ref 0.0–1.2)
CO2: 18 mmol/L — ABNORMAL LOW (ref 20–29)
Calcium: 9.2 mg/dL (ref 8.7–10.2)
Chloride: 105 mmol/L (ref 96–106)
Creatinine, Ser: 1.05 mg/dL (ref 0.76–1.27)
Globulin, Total: 2.4 g/dL (ref 1.5–4.5)
Glucose: 69 mg/dL — ABNORMAL LOW (ref 70–99)
Potassium: 4.3 mmol/L (ref 3.5–5.2)
Sodium: 139 mmol/L (ref 134–144)
Total Protein: 6.9 g/dL (ref 6.0–8.5)
eGFR: 89 mL/min/{1.73_m2} (ref >59)

## 2024-01-11 NOTE — Progress Notes (Signed)
 Myrtie Atkinson,   Kidney function improving!  Liver enzymes improving as well.

## 2024-01-20 MED ORDER — LEVOTHYROXINE SODIUM 200 MCG PO TABS: 200.0000 ug | ORAL_TABLET | Freq: Every day | ORAL | 0 refills | Status: DC

## 2024-01-20 NOTE — Telephone Encounter (Signed)
 Patient informed.

## 2024-01-20 NOTE — Telephone Encounter (Signed)
 Forwarding message to DR. Metheney covering [facility].  Spoke with patient - lab work from 07/11/2023 for TSH  had resulted very high Jade had placed an order for the labs to be redrawn  I asked the patient to come in for this testing - he has not missed any doses and has about 1 1/2 week of medication remaining.

## 2024-01-20 NOTE — Telephone Encounter (Signed)
 30 tabs sent to pharmacy but he needs to come in and have a TSH drawn I am not sure why it was not ordered with the June labs but it was not.  So if he could come back in in the next couple weeks so that we can make sure that he is on the appropriate dose since the last level was out of range.  Meds ordered this encounter  Medications   levothyroxine  (SYNTHROID ) 200 MCG tablet    Sig: Take 1 tablet (200 mcg total) by mouth daily.    Dispense:  30 tablet    Refill:  0

## 2024-02-15 ENCOUNTER — Other Ambulatory Visit: Payer: Self-pay | Admitting: Family Medicine

## 2024-02-15 DIAGNOSIS — E039 Hypothyroidism, unspecified: Secondary | ICD-10-CM

## 2024-04-13 ENCOUNTER — Ambulatory Visit: Admitting: Physician Assistant

## 2024-04-20 ENCOUNTER — Ambulatory Visit: Admitting: Physician Assistant

## 2024-04-20 ENCOUNTER — Encounter: Payer: Self-pay | Admitting: Physician Assistant

## 2024-04-20 VITALS — BP 115/74 | HR 96 | Ht 73.0 in | Wt 213.0 lb

## 2024-04-20 DIAGNOSIS — I10 Essential (primary) hypertension: Secondary | ICD-10-CM

## 2024-04-20 DIAGNOSIS — Z23 Encounter for immunization: Secondary | ICD-10-CM

## 2024-04-20 DIAGNOSIS — E1165 Type 2 diabetes mellitus with hyperglycemia: Secondary | ICD-10-CM

## 2024-04-20 DIAGNOSIS — Z7984 Long term (current) use of oral hypoglycemic drugs: Secondary | ICD-10-CM

## 2024-04-20 DIAGNOSIS — E039 Hypothyroidism, unspecified: Secondary | ICD-10-CM | POA: Diagnosis not present

## 2024-04-20 DIAGNOSIS — E785 Hyperlipidemia, unspecified: Secondary | ICD-10-CM | POA: Diagnosis not present

## 2024-04-20 LAB — POCT GLYCOSYLATED HEMOGLOBIN (HGB A1C): Hemoglobin A1C: 7.3 % — AB (ref 4.0–5.6)

## 2024-04-20 MED ORDER — LEVOTHYROXINE SODIUM 200 MCG PO TABS: 200.0000 ug | ORAL_TABLET | Freq: Every day | ORAL | 1 refills | Status: AC

## 2024-04-20 NOTE — Progress Notes (Signed)
 Established Patient Office Visit  Subjective   Patient ID: Austin Weeks, male    DOB: 07/16/1976  Age: 48 y.o. MRN: [id number]  Chief Complaint  Patient presents with   Medical Management of Chronic Issues    DM - Last A1c 6.9    HPI Discussed the use of AI scribe software for clinical note transcription with the patient, who gave verbal consent to proceed.  History of Present Illness Austin Weeks is a 48 year old male with type two diabetes who presents for a three month follow-up.  Glycemic control - Type 2 diabetes mellitus with last A1c of 7.3% (previously 6.9%) - Nocturnal hypoglycemia absent - Blood glucose monitored twice daily; recent morning reading 117 mg/dL - Postprandial hyperglycemia present, with blood glucose returning to baseline after meals - No current use of insulin  due to prior hypoglycemia with long-acting insulin  - Current medications: Synjardy , glipizide  twice daily, Actos  (pioglitazone ) - GLP-1 receptor agonists avoided due to history of pancreatitis  Weight changes - Weight loss of approximately 10 pounds since last visit  Physical activity - Engaged in regular physical activity for nearly seven months - Completed a two-month exercise program - Currently on a two-week break from exercise due to soreness - Plans to resume physical activity after break  Cardiopulmonary symptoms - No chest pain - No shortness of breath    Review of Systems  All other systems reviewed and are negative.     Objective:     BP 115/74 (BP Location: Right Arm, Patient Position: Sitting, Cuff Size: Normal)   Pulse 96   Ht 6' 1 (1.854 m)   Wt 213 lb (96.6 kg)   SpO2 97%   BMI 28.10 kg/m  BP Readings from Last 3 Encounters:  04/20/24 115/74  01/10/24 (!) 131/90  10/10/23 122/78   Wt Readings from Last 3 Encounters:  04/20/24 213 lb (96.6 kg)  01/10/24 224 lb (101.6 kg)  10/10/23 233 lb (105.7 kg)      Physical Exam Constitutional:       Appearance: Normal appearance.  Cardiovascular:     Rate and Rhythm: Normal rate and regular rhythm.  Pulmonary:     Effort: Pulmonary effort is normal.     Breath sounds: Normal breath sounds.  Neurological:     Mental Status: He is alert and oriented to person, place, and time.  Psychiatric:        Mood and Affect: Mood normal.      Results for orders placed or performed in visit on 04/20/24  POCT HgB A1C  Result Value Ref Range   Hemoglobin A1C 7.3 (A) 4.0 - 5.6 %   HbA1c POC (<> result, manual entry)     HbA1c, POC (prediabetic range)     HbA1c, POC (controlled diabetic range)       The 10-year ASCVD risk score (Arnett DK, et al., 2019) is: 6.9%    Assessment & Plan:  Austin Weeks was seen today for medical management of chronic issues.  Diagnoses and all orders for this visit:  Uncontrolled type 2 diabetes mellitus with hyperglycemia (HCC) -     POCT HgB A1C -     Ambulatory referral to Ophthalmology  Essential hypertension  Acquired hypothyroidism -     levothyroxine  (SYNTHROID ) 200 MCG tablet; Take 1 tablet (200 mcg total) by mouth daily.  Dyslipidemia, goal LDL below 70  Need for influenza vaccination -     Flu vaccine trivalent PF, 6mos and older(Flulaval,Afluria,Fluarix,Fluzone)  Assessment & Plan  Type 2 diabetes with increased A1c from 6.9% to 7.3% and not to goal, uncontrolled.  Off insulin  due to hypoglycemia. Continues on Synjardy , glipizide , and pioglitazone . Unable to use GLP-1 agonists due to pancreatitis. Issues with Libre sensor adhesion. Engaged in weight loss and exercise. Goal A1c <7%. - Continue Synjardy , glipizide , and pioglitazone . - Encourage dietary modifications to reduce carbohydrates and increase protein. - Advise on secondary adhesive for Libre sensor. - Encourage continuation of exercise regimen after break. - Administer flu shot today. - Ensure medication refills. - Refer for ophthalmology appointment.  Essential  hypertension Well-controlled with BP at 115/74 mmHg.  LDL under 70 - on statin  Flu shot given today. Declined covid and pneumonia vaccine.     Return in about 3 months (around 07/20/2024).    Stefano Trulson, PA-C

## 2024-05-09 LAB — OPHTHALMOLOGY REPORT-SCANNED

## 2024-05-10 DIAGNOSIS — E11319 Type 2 diabetes mellitus with unspecified diabetic retinopathy without macular edema: Secondary | ICD-10-CM

## 2024-05-11 DIAGNOSIS — E11319 Type 2 diabetes mellitus with unspecified diabetic retinopathy without macular edema: Secondary | ICD-10-CM | POA: Insufficient documentation

## 2024-05-11 NOTE — Progress Notes (Signed)
 Retinopathy noted on DM eye exam.

## 2024-07-06 ENCOUNTER — Other Ambulatory Visit: Payer: Self-pay | Admitting: Physician Assistant

## 2024-07-06 DIAGNOSIS — E039 Hypothyroidism, unspecified: Secondary | ICD-10-CM

## 2024-07-20 ENCOUNTER — Ambulatory Visit: Admitting: Physician Assistant

## 2024-07-27 ENCOUNTER — Ambulatory Visit: Admitting: Physician Assistant

## 2024-07-27 VITALS — BP 130/72 | HR 77 | Ht 73.0 in | Wt 214.0 lb

## 2024-07-27 DIAGNOSIS — E039 Hypothyroidism, unspecified: Secondary | ICD-10-CM | POA: Diagnosis not present

## 2024-07-27 DIAGNOSIS — E785 Hyperlipidemia, unspecified: Secondary | ICD-10-CM | POA: Diagnosis not present

## 2024-07-27 DIAGNOSIS — E1169 Type 2 diabetes mellitus with other specified complication: Secondary | ICD-10-CM

## 2024-07-27 DIAGNOSIS — E663 Overweight: Secondary | ICD-10-CM

## 2024-07-27 DIAGNOSIS — I10 Essential (primary) hypertension: Secondary | ICD-10-CM | POA: Diagnosis not present

## 2024-07-27 LAB — POCT GLYCOSYLATED HEMOGLOBIN (HGB A1C): Hemoglobin A1C: 6.9 % — AB (ref 4.0–5.6)

## 2024-07-27 NOTE — Progress Notes (Signed)
 "  Established Patient Office Visit  Subjective   Patient ID: Austin Weeks, male    DOB: Nov 16, 1975  Age: 49 y.o. MRN: [id number]  No chief complaint on file.   HPI .Discussed the use of AI scribe software for clinical note transcription with the patient, who gave verbal consent to proceed.  History of Present Illness Austin Weeks is a 49 year old male with type 2 diabetes who presents for diabetes management and follow-up.  Glycemic control - HbA1c decreased from 7.3% in September to 6.9% currently - Glucose levels within target range approximately 80% of the time - Predictable postprandial hyperglycemia, especially after meals - Occasional hypoglycemic episodes, with blood glucose dropping to 64 mg/dL, particularly when glipizide  is taken close to bedtime - Hypoglycemia managed with oral intake of sweets, such as orange juice - Uses a glucose sensor for monitoring, with generally well-controlled glucose throughout the day except for postprandial spikes  Lifestyle modifications - Consistent exercise regimen, working out six days per week for the past nine months - Practices portion control and incorporates more protein into meals to manage postprandial glucose spikes - Experienced some dietary lapses during the holidays, including consumption of sweets  Peripheral sensation - Improved sensation in feet compared to previous years - Previously unable to perform tasks with feet due to poor glycemic control, now able to do so  Weight and body composition - Weight fluctuations attributed to muscle gain from exercise - Decreased pant size from 36 to 34, with a goal of reaching size 32  Medication regimen - Morning: lisinopril , pioglitazone , Synjardy , glipizide  - Evening: glipizide , Synjardy , atorvastatin     ROS See HPI.    Objective:     BP 137/77   Pulse 77   Ht 6' 1 (1.854 m)   Wt 214 lb (97.1 kg)   SpO2 99%   BMI 28.23 kg/m  BP Readings from Last 3 Encounters:   07/27/24 137/77  04/20/24 115/74  01/10/24 (!) 131/90   Wt Readings from Last 3 Encounters:  07/27/24 214 lb (97.1 kg)  04/20/24 213 lb (96.6 kg)  01/10/24 224 lb (101.6 kg)     . Diabetic Foot Exam - Simple   Simple Foot Form Diabetic Foot exam was performed with the following findings: Yes 07/27/2024  2:57 PM  Visual Inspection No deformities, no ulcerations, no other skin breakdown bilaterally: Yes Sensation Testing Intact to touch and monofilament testing bilaterally: Yes Pulse Check Posterior Tibialis and Dorsalis pulse intact bilaterally: Yes Comments    . Results for orders placed or performed in visit on 07/27/24  POCT HgB A1C   Collection Time: 07/27/24  4:24 PM  Result Value Ref Range   Hemoglobin A1C 6.9 (A) 4.0 - 5.6 %   HbA1c POC (<> result, manual entry)     HbA1c, POC (prediabetic range)     HbA1c, POC (controlled diabetic range)       Physical Exam Constitutional:      Appearance: Normal appearance.  HENT:     Head: Normocephalic.  Cardiovascular:     Rate and Rhythm: Normal rate and regular rhythm.  Pulmonary:     Effort: Pulmonary effort is normal.     Breath sounds: Normal breath sounds.  Musculoskeletal:     Cervical back: Normal range of motion and neck supple.  Neurological:     General: No focal deficit present.     Mental Status: He is alert and oriented to person, place, and time.  Psychiatric:  Mood and Affect: Mood normal.      The 10-year ASCVD risk score (Arnett DK, et al., 2019) is: 10.2%    Assessment & Plan:  .Diagnoses and all orders for this visit:  Hyperlipidemia associated with type 2 diabetes mellitus (HCC) -     POCT HgB A1C -     CMP14+EGFR -     Lipid panel  Hypothyroidism, unspecified type -     TSH + free T4 -     CMP14+EGFR  Essential hypertension -     CMP14+EGFR    Assessment & Plan Type 2 diabetes mellitus with hyperglycemia Type 2 diabetes mellitus with improved glycemic control.  Hemoglobin A1c decreased from 7.3% to 6.9%. Improved sensation in feet suggests potential nerve regeneration. Occasional hypoglycemia when taking glipizide  without a meal. - Continue Synjardy , glipizide , pioglitazone , and lisinopril . - Advised taking glipizide  with meals to prevent hypoglycemia. - Encouraged continued exercise and dietary modifications, including portion control and increased protein intake. - Monitor blood glucose levels using Libre sensor. - Aim for further reduction in hemoglobin A1c to 6.5%. - Foot exam UTD - Eye exam UTD. - BP to goal, on ACE. - ON STATIN - Declined vaccines today  Hypothyroidism Management requires evaluation due to recent weight loss and potential changes in thyroid  function. - Ordered thyroid  function tests to assess current thyroid  status.   Return in about 3 months (around 10/25/2024).    Saphira Lahmann, PA-C  "

## 2024-10-24 ENCOUNTER — Ambulatory Visit: Admitting: Physician Assistant
